# Patient Record
Sex: Male | Born: 1955
Health system: Southern US, Community
[De-identification: ages and names within clinical notes are randomized; demographics above are authoritative.]

## PROBLEM LIST (undated history)

## (undated) DIAGNOSIS — R42 Dizziness and giddiness: Secondary | ICD-10-CM

## (undated) DIAGNOSIS — F419 Anxiety disorder, unspecified: Secondary | ICD-10-CM

## (undated) DIAGNOSIS — R413 Other amnesia: Secondary | ICD-10-CM

## (undated) DIAGNOSIS — F418 Other specified anxiety disorders: Secondary | ICD-10-CM

## (undated) DIAGNOSIS — R55 Syncope and collapse: Principal | ICD-10-CM

## (undated) DIAGNOSIS — Z9889 Other specified postprocedural states: Secondary | ICD-10-CM

## (undated) DIAGNOSIS — F329 Major depressive disorder, single episode, unspecified: Secondary | ICD-10-CM

## (undated) DIAGNOSIS — F32A Depression, unspecified: Secondary | ICD-10-CM

## (undated) DIAGNOSIS — R112 Nausea with vomiting, unspecified: Secondary | ICD-10-CM

## (undated) HISTORY — PX: NECK SURGERY: SHX720

## (undated) HISTORY — DX: Other amnesia: R41.3

## (undated) HISTORY — DX: Syncope and collapse: R55

## (undated) HISTORY — PX: VASECTOMY: SHX75

## (undated) HISTORY — DX: Dizziness and giddiness: R42

## (undated) HISTORY — PX: OTHER SURGICAL HISTORY: SHX169

## (undated) HISTORY — DX: Other specified anxiety disorders: F41.8

## (undated) HISTORY — PX: TONSILLECTOMY: SUR1361

## (undated) NOTE — *Deleted (*Deleted)
Per [facility]:

---

## 2003-10-26 ENCOUNTER — Ambulatory Visit (HOSPITAL_COMMUNITY): Admission: RE | Admit: 2003-10-26 | Discharge: 2003-10-26 | Payer: Self-pay | Admitting: Orthopedic Surgery

## 2009-11-29 ENCOUNTER — Encounter: Admission: RE | Admit: 2009-11-29 | Discharge: 2009-11-29 | Payer: Self-pay | Admitting: Family Medicine

## 2010-02-19 ENCOUNTER — Ambulatory Visit (HOSPITAL_COMMUNITY): Admission: RE | Admit: 2010-02-19 | Discharge: 2010-02-20 | Payer: Self-pay | Admitting: Neurosurgery

## 2010-06-21 ENCOUNTER — Emergency Department (HOSPITAL_COMMUNITY): Admission: EM | Admit: 2010-06-21 | Discharge: 2010-06-22 | Payer: Self-pay | Admitting: Emergency Medicine

## 2011-01-06 LAB — CBC
HCT: 36.5 % — ABNORMAL LOW (ref 39.0–52.0)
Hemoglobin: 13 g/dL (ref 13.0–17.0)
MCHC: 35.8 g/dL (ref 30.0–36.0)
MCV: 94.6 fL (ref 78.0–100.0)
Platelets: 189 10*3/uL (ref 150–400)
RBC: 3.85 MIL/uL — ABNORMAL LOW (ref 4.22–5.81)
RDW: 12.7 % (ref 11.5–15.5)
WBC: 5.3 10*3/uL (ref 4.0–10.5)

## 2011-01-06 LAB — SURGICAL PCR SCREEN
MRSA, PCR: NEGATIVE
Staphylococcus aureus: POSITIVE — AB

## 2011-05-13 ENCOUNTER — Emergency Department (HOSPITAL_COMMUNITY)
Admission: EM | Admit: 2011-05-13 | Discharge: 2011-05-13 | Disposition: A | Discharge status: Home / Self Care | Payer: 59 | Attending: Emergency Medicine | Admitting: Emergency Medicine

## 2011-05-13 ENCOUNTER — Emergency Department (HOSPITAL_COMMUNITY): Payer: 59

## 2011-05-13 DIAGNOSIS — R0602 Shortness of breath: Secondary | ICD-10-CM | POA: Insufficient documentation

## 2011-05-13 DIAGNOSIS — R0989 Other specified symptoms and signs involving the circulatory and respiratory systems: Secondary | ICD-10-CM | POA: Insufficient documentation

## 2011-05-13 DIAGNOSIS — R0609 Other forms of dyspnea: Secondary | ICD-10-CM | POA: Insufficient documentation

## 2011-05-13 LAB — DIFFERENTIAL
Basophils Absolute: 0 10*3/uL (ref 0.0–0.1)
Eosinophils Relative: 3 % (ref 0–5)
Lymphocytes Relative: 38 % (ref 12–46)
Lymphs Abs: 1.7 10*3/uL (ref 0.7–4.0)
Monocytes Absolute: 0.4 10*3/uL (ref 0.1–1.0)
Neutro Abs: 2.3 10*3/uL (ref 1.7–7.7)

## 2011-05-13 LAB — CBC
HCT: 42.6 % (ref 39.0–52.0)
Hemoglobin: 14.6 g/dL (ref 13.0–17.0)
MCV: 91 fL (ref 78.0–100.0)
RDW: 12.2 % (ref 11.5–15.5)
WBC: 4.5 10*3/uL (ref 4.0–10.5)

## 2011-05-13 LAB — BASIC METABOLIC PANEL
CO2: 30 mEq/L (ref 19–32)
Chloride: 102 mEq/L (ref 96–112)
Glucose, Bld: 100 mg/dL — ABNORMAL HIGH (ref 70–99)
Sodium: 138 mEq/L (ref 135–145)

## 2011-05-13 LAB — TROPONIN I: Troponin I: <0.3 ng/mL (ref <0.30)

## 2011-05-13 LAB — PRO B NATRIURETIC PEPTIDE: Pro B Natriuretic peptide (BNP): 41.7 pg/mL (ref 0–125)

## 2013-03-21 ENCOUNTER — Encounter: Payer: Self-pay | Admitting: Neurology

## 2013-03-21 ENCOUNTER — Ambulatory Visit (INDEPENDENT_AMBULATORY_CARE_PROVIDER_SITE_OTHER): Payer: 59 | Admitting: Neurology

## 2013-03-21 VITALS — BP 113/69 | HR 49 | Ht 69.0 in | Wt 173.0 lb

## 2013-03-21 DIAGNOSIS — R413 Other amnesia: Secondary | ICD-10-CM

## 2013-03-21 DIAGNOSIS — R55 Syncope and collapse: Secondary | ICD-10-CM

## 2013-03-21 HISTORY — DX: Other amnesia: R41.3

## 2013-03-21 HISTORY — DX: Syncope and collapse: R55

## 2013-03-21 NOTE — Progress Notes (Signed)
Reason for visit: Dizziness  Shane Beard is a 57 y.o. male  History of present illness:  Shane Beard is a 57 year old right-handed white male with a history of episodes of near-syncope. The patient indicates that he has had some issues over the last 2 years. The patient indicates that he will have lightheaded sensations that occur when going from a stooping or bending position to full standing. The patient has had several episodes of near-syncope associated with visual loss, and occasionally with jerking of the extremities. The patient has a job requirement that includes frequent stooping and bending. The patient is a marathon runner, and he has had one episode of syncope after a marathon. The patient has undergone a cardiac evaluation with a stress test, and with cardiac monitoring for one month. The patient denies any focal numbness or weakness of the face, arms, or legs, with the exception that he does have some tingling in the hands that he relates to cervical spine disease. The patient however, goes on to indicate that he has developed a problem with memory over the last 2 years. This is progressively getting worse, associated with short-term memory problems, difficulty remembering names for people, and difficulty with directions while driving. The patient is having a harder time functioning at work. The patient denies any problems controlling the bowels or bladder, and he denies any significant problems with fatigue or any sleep issues. The patient is sent to this office for further evaluation.  Past Medical History  Diagnosis Date  . Dizziness   . Syncope and collapse 03/21/2013  . Memory difficulty 03/21/2013  . Depression with anxiety     Past Surgical History  Procedure Laterality Date  . Neck surgery    . Vasectomy    . Tonsillectomy      Family History  Problem Relation Age of Onset  . Thyroid disease Sister   . High blood pressure Brother   . Heart Problems Father      Social history:  reports that he has quit smoking. His smoking use included Cigarettes. He smoked 0.00 packs per day. He has never used smokeless tobacco. He reports that he drinks about 2.0 ounces of alcohol per week. He reports that he does not use illicit drugs.  Medications:  No current outpatient prescriptions on file prior to visit.   No current facility-administered medications on file prior to visit.    Allergies: No Known Allergies  ROS:  Out of a complete 14 system review of symptoms, the patient complains only of the following symptoms, and all other reviewed systems are negative.  Hearing loss, ringing in the ears Eye pain Memory loss, confusion, headache, slurred speech, dizziness  Blood pressure 113/69, pulse 49, height 5\' 9"  (1.753 m), weight 173 lb (78.472 kg).  Blood pressure standing, right arm, is 138/82. Blood pressure sitting, right arm, is 124/68.  Physical Exam  General: The patient is alert and cooperative at the time of the examination.  Head: Pupils are equal, round, and reactive to light. Discs are flat bilaterally.  Neck: The neck is supple, no carotid bruits are noted.  Respiratory: The respiratory examination is clear.  Cardiovascular: The cardiovascular examination reveals a regular rate and rhythm, no obvious murmurs or rubs are noted.  Skin: Extremities are without significant edema.  Neurologic Exam  Mental status: Mini-Mental status examination done today shows a total score of 30 out of 30. The patient is able to name 21 animals in 60 seconds.  Cranial nerves:  Facial symmetry is present. There is good sensation of the face to pinprick and soft touch bilaterally. The strength of the facial muscles and the muscles to head turning and shoulder shrug are normal bilaterally. Speech is well enunciated, no aphasia or dysarthria is noted. Extraocular movements are full. Visual fields are full.  Motor: The motor testing reveals 5 over 5  strength of all 4 extremities. Good symmetric motor tone is noted throughout.  Sensory: Sensory testing is intact to pinprick, soft touch, vibration sensation, and position sense on all 4 extremities. No evidence of extinction is noted.  Coordination: Cerebellar testing reveals good finger-nose-finger and heel-to-shin bilaterally.  Gait and station: Gait is normal. Tandem gait is normal. Romberg is negative. No drift is seen.  Reflexes: Deep tendon reflexes are symmetric and normal bilaterally, with the exception that the ankle jerk reflexes are depressed bilaterally. Toes are downgoing bilaterally.   Assessment/Plan:  1. History syncope, near-syncope  2. Memory disturbance  The patient appears to have syncope or near-syncope associated with rapid changes in position. The patient is a marathon runner, and he runs a relatively low heart rate. This may make him more susceptible to blackouts. The patient indicates that he maintains hydration throughout the day. The patient will need to be careful about standing up less rapidly, and he will need to be holding onto something up he gets dizzy, and bend over slightly to allow the episode past. The patient however, also has had some problems with memory. The patient will be set up for MRI evaluation of the brain, and carotid Doppler studies. The patient will have blood work done today. The patient will followup in about 6 months.  Marlan Palau MD 03/21/2013 8:37 PM  [facility] [address] [place], [place] [address]  Phone [phone] Fax [phone]

## 2013-03-22 LAB — RPR: RPR: NONREACTIVE

## 2013-03-22 LAB — TSH: TSH: 1.75 u[IU]/mL (ref 0.450–4.500)

## 2013-03-22 LAB — VITAMIN B12: Vitamin B-12: 627 pg/mL (ref 211–946)

## 2013-03-22 LAB — SEDIMENTATION RATE: Sed Rate: 2 mm/hr (ref 0–30)

## 2013-03-22 NOTE — Progress Notes (Signed)
Quick Note:  Left message that lab results were unremarkable, per Dr. Anne Hahn. ______

## 2013-03-23 ENCOUNTER — Ambulatory Visit (INDEPENDENT_AMBULATORY_CARE_PROVIDER_SITE_OTHER): Payer: 59

## 2013-03-23 DIAGNOSIS — R413 Other amnesia: Secondary | ICD-10-CM

## 2013-03-23 DIAGNOSIS — R55 Syncope and collapse: Secondary | ICD-10-CM

## 2013-03-24 ENCOUNTER — Telehealth: Payer: Self-pay | Admitting: Neurology

## 2013-03-24 NOTE — Telephone Encounter (Signed)
I called patient. The MRI study of the brain shows minimal small vessel changes, and some atrophy. The patient reports very mild memory disturbance, not clear if the atrophy is associated with this. The patient will need to be followed over time.

## 2013-04-04 ENCOUNTER — Ambulatory Visit (INDEPENDENT_AMBULATORY_CARE_PROVIDER_SITE_OTHER): Payer: 59

## 2013-04-04 DIAGNOSIS — R413 Other amnesia: Secondary | ICD-10-CM

## 2013-04-04 DIAGNOSIS — R55 Syncope and collapse: Secondary | ICD-10-CM

## 2013-04-18 ENCOUNTER — Telehealth: Payer: Self-pay | Admitting: Neurology

## 2013-04-18 NOTE — Telephone Encounter (Signed)
I called patient. The carotid Doppler study is unremarkable. 

## 2013-09-18 ENCOUNTER — Telehealth: Payer: Self-pay | Admitting: *Deleted

## 2013-09-19 NOTE — Telephone Encounter (Signed)
Please advise 

## 2013-09-19 NOTE — Telephone Encounter (Signed)
I called patient. The patient no longer having episodes of near-syncope. I will cancel his revisit appointment.

## 2013-09-28 ENCOUNTER — Ambulatory Visit: Payer: 59 | Admitting: Neurology

## 2014-01-10 ENCOUNTER — Other Ambulatory Visit: Payer: Self-pay | Admitting: Neurosurgery

## 2014-01-10 DIAGNOSIS — M5412 Radiculopathy, cervical region: Secondary | ICD-10-CM

## 2014-01-16 ENCOUNTER — Ambulatory Visit
Admission: RE | Admit: 2014-01-16 | Discharge: 2014-01-16 | Disposition: A | Discharge status: Home / Self Care | Payer: 59 | Source: Ambulatory Visit | Attending: Neurosurgery | Admitting: Neurosurgery

## 2014-01-16 DIAGNOSIS — M5412 Radiculopathy, cervical region: Secondary | ICD-10-CM

## 2014-02-09 ENCOUNTER — Other Ambulatory Visit: Payer: Self-pay | Admitting: Neurosurgery

## 2014-03-01 ENCOUNTER — Inpatient Hospital Stay (HOSPITAL_COMMUNITY): Admission: RE | Admit: 2014-03-01 | Payer: 59 | Source: Ambulatory Visit

## 2014-04-18 ENCOUNTER — Encounter (HOSPITAL_COMMUNITY): Payer: Self-pay | Admitting: Pharmacy Technician

## 2014-04-19 ENCOUNTER — Encounter (HOSPITAL_COMMUNITY)
Admission: RE | Admit: 2014-04-19 | Discharge: 2014-04-19 | Disposition: A | Discharge status: Home / Self Care | Payer: 59 | Source: Ambulatory Visit | Attending: Neurosurgery | Admitting: Neurosurgery

## 2014-04-19 ENCOUNTER — Encounter (HOSPITAL_COMMUNITY): Payer: Self-pay

## 2014-04-19 DIAGNOSIS — Z01812 Encounter for preprocedural laboratory examination: Secondary | ICD-10-CM | POA: Insufficient documentation

## 2014-04-19 HISTORY — DX: Major depressive disorder, single episode, unspecified: F32.9

## 2014-04-19 HISTORY — DX: Anxiety disorder, unspecified: F41.9

## 2014-04-19 HISTORY — DX: Nausea with vomiting, unspecified: R11.2

## 2014-04-19 HISTORY — DX: Depression, unspecified: F32.A

## 2014-04-19 HISTORY — DX: Other specified postprocedural states: Z98.890

## 2014-04-19 LAB — BASIC METABOLIC PANEL
Anion gap: 12 (ref 5–15)
BUN: 23 mg/dL (ref 6–23)
CO2: 28 mEq/L (ref 19–32)
Calcium: 9.1 mg/dL (ref 8.4–10.5)
Chloride: 99 mEq/L (ref 96–112)
Creatinine, Ser: 1.24 mg/dL (ref 0.50–1.35)
GFR calc Af Amer: 72 mL/min — ABNORMAL LOW (ref >90)
GFR calc non Af Amer: 62 mL/min — ABNORMAL LOW (ref >90)
Glucose, Bld: 92 mg/dL (ref 70–99)
Potassium: 4.2 mEq/L (ref 3.7–5.3)
Sodium: 139 mEq/L (ref 137–147)

## 2014-04-19 LAB — CBC
HCT: 39.1 % (ref 39.0–52.0)
Hemoglobin: 13.5 g/dL (ref 13.0–17.0)
MCH: 32.6 pg (ref 26.0–34.0)
MCHC: 34.5 g/dL (ref 30.0–36.0)
MCV: 94.4 fL (ref 78.0–100.0)
Platelets: 181 10*3/uL (ref 150–400)
RBC: 4.14 MIL/uL — ABNORMAL LOW (ref 4.22–5.81)
RDW: 12.3 % (ref 11.5–15.5)
WBC: 5.7 10*3/uL (ref 4.0–10.5)

## 2014-04-19 LAB — SURGICAL PCR SCREEN
MRSA, PCR: NEGATIVE
Staphylococcus aureus: POSITIVE — AB

## 2014-04-19 NOTE — Pre-Procedure Instructions (Signed)
ION GONNELLA  04/19/2014   Your procedure is scheduled on:  04/30/14  Report to [facility] Admitting at 530 AM.  Call this number if you have problems the morning of surgery: [phone]   Remember:   Do not eat food or drink liquids after midnight.   Take these medicines the morning of surgery with A SIP OF WATER: none   Do not wear jewelry, make-up or nail polish.  Do not wear lotions, powders, or perfumes. You may wear deodorant.  Do not shave 48 hours prior to surgery. Men may shave face and neck.  Do not bring valuables to the hospital.  [facility] is not responsible                  for any belongings or valuables.               Contacts, dentures or bridgework may not be worn into surgery.  Leave suitcase in the car. After surgery it may be brought to your room.  For patients admitted to the hospital, discharge time is determined by your                treatment team.               Patients discharged the day of surgery will not be allowed to drive  home.  Name and phone number of your driver: family  Special Instructions: Shower using CHG 2 nights before surgery and the night before surgery.  If you shower the day of surgery use CHG.  Use special wash - you have one bottle of CHG for all showers.  You should use approximately 1/3 of the bottle for each shower.   Please read over the following fact sheets that you were given: Pain Booklet, Coughing and Deep Breathing, MRSA Information and Surgical Site Infection Prevention

## 2014-04-29 MED ORDER — CEFAZOLIN SODIUM-DEXTROSE 2-3 GM-% IV SOLR
2.0000 g | INTRAVENOUS | Status: AC
  Administered 2014-04-30: 2 g via INTRAVENOUS
  Filled 2014-04-29: qty 50

## 2014-04-30 ENCOUNTER — Encounter (HOSPITAL_COMMUNITY)
Admission: RE | Disposition: A | Discharge status: Home / Self Care | Payer: Self-pay | Source: Ambulatory Visit | Attending: Neurosurgery

## 2014-04-30 ENCOUNTER — Ambulatory Visit (HOSPITAL_COMMUNITY): Payer: 59 | Admitting: Certified Registered"

## 2014-04-30 ENCOUNTER — Encounter (HOSPITAL_COMMUNITY): Payer: Self-pay | Admitting: Certified Registered"

## 2014-04-30 ENCOUNTER — Observation Stay (HOSPITAL_COMMUNITY)
Admission: RE | Admit: 2014-04-30 | Discharge: 2014-05-01 | Disposition: A | Discharge status: Home / Self Care | Payer: 59 | Source: Ambulatory Visit | Attending: Neurosurgery | Admitting: Neurosurgery

## 2014-04-30 ENCOUNTER — Encounter (HOSPITAL_COMMUNITY): Payer: 59 | Admitting: Certified Registered"

## 2014-04-30 ENCOUNTER — Ambulatory Visit (HOSPITAL_COMMUNITY): Payer: 59

## 2014-04-30 DIAGNOSIS — F411 Generalized anxiety disorder: Secondary | ICD-10-CM | POA: Insufficient documentation

## 2014-04-30 DIAGNOSIS — Y838 Other surgical procedures as the cause of abnormal reaction of the patient, or of later complication, without mention of misadventure at the time of the procedure: Secondary | ICD-10-CM | POA: Insufficient documentation

## 2014-04-30 DIAGNOSIS — Z87891 Personal history of nicotine dependence: Secondary | ICD-10-CM | POA: Insufficient documentation

## 2014-04-30 DIAGNOSIS — IMO0002 Reserved for concepts with insufficient information to code with codable children: Secondary | ICD-10-CM | POA: Insufficient documentation

## 2014-04-30 DIAGNOSIS — Y9210 Unspecified residential institution as the place of occurrence of the external cause: Secondary | ICD-10-CM | POA: Insufficient documentation

## 2014-04-30 DIAGNOSIS — S129XXA Fracture of neck, unspecified, initial encounter: Secondary | ICD-10-CM

## 2014-04-30 DIAGNOSIS — F329 Major depressive disorder, single episode, unspecified: Secondary | ICD-10-CM | POA: Insufficient documentation

## 2014-04-30 DIAGNOSIS — F3289 Other specified depressive episodes: Secondary | ICD-10-CM | POA: Insufficient documentation

## 2014-04-30 DIAGNOSIS — R338 Other retention of urine: Secondary | ICD-10-CM | POA: Insufficient documentation

## 2014-04-30 DIAGNOSIS — R413 Other amnesia: Secondary | ICD-10-CM | POA: Insufficient documentation

## 2014-04-30 DIAGNOSIS — T84498A Other mechanical complication of other internal orthopedic devices, implants and grafts, initial encounter: Principal | ICD-10-CM | POA: Insufficient documentation

## 2014-04-30 DIAGNOSIS — N9989 Other postprocedural complications and disorders of genitourinary system: Secondary | ICD-10-CM | POA: Insufficient documentation

## 2014-04-30 HISTORY — PX: POSTERIOR CERVICAL FUSION/FORAMINOTOMY: SHX5038

## 2014-04-30 SURGERY — POSTERIOR CERVICAL FUSION/FORAMINOTOMY LEVEL 1
Anesthesia: General | Laterality: Right

## 2014-04-30 MED ORDER — PHENOL 1.4 % MT LIQD: 1.0000 | OROMUCOSAL | Status: DC | PRN

## 2014-04-30 MED ORDER — KETOROLAC TROMETHAMINE 30 MG/ML IJ SOLN
30.0000 mg | Freq: Four times a day (QID) | INTRAMUSCULAR | Status: DC
  Administered 2014-04-30 – 2014-05-01 (×3): 30 mg via INTRAVENOUS
  Filled 2014-04-30 (×7): qty 1

## 2014-04-30 MED ORDER — ACETAMINOPHEN 10 MG/ML IV SOLN
INTRAVENOUS | Status: AC
  Administered 2014-04-30: 1000 mg via INTRAVENOUS
  Filled 2014-04-30: qty 100

## 2014-04-30 MED ORDER — CYCLOBENZAPRINE HCL 10 MG PO TABS
ORAL_TABLET | ORAL | Status: AC
Start: 2014-04-30 — End: 2014-05-01
  Filled 2014-04-30: qty 1

## 2014-04-30 MED ORDER — THROMBIN 20000 UNITS EX SOLR
CUTANEOUS | Status: DC | PRN
  Administered 2014-04-30: 11:00:00 via TOPICAL

## 2014-04-30 MED ORDER — PHENYLEPHRINE HCL 10 MG/ML IJ SOLN
INTRAMUSCULAR | Status: DC | PRN
  Administered 2014-04-30 (×3): 80 ug via INTRAVENOUS

## 2014-04-30 MED ORDER — EPHEDRINE SULFATE 50 MG/ML IJ SOLN
INTRAMUSCULAR | Status: DC | PRN
  Administered 2014-04-30: 20 mg via INTRAVENOUS

## 2014-04-30 MED ORDER — ACETAMINOPHEN 650 MG RE SUPP: 650.0000 mg | RECTAL | Status: DC | PRN

## 2014-04-30 MED ORDER — MIDAZOLAM HCL 2 MG/2ML IJ SOLN
INTRAMUSCULAR | Status: AC
  Filled 2014-04-30: qty 2

## 2014-04-30 MED ORDER — LIDOCAINE HCL (CARDIAC) 20 MG/ML IV SOLN
INTRAVENOUS | Status: AC
  Filled 2014-04-30: qty 5

## 2014-04-30 MED ORDER — SODIUM CHLORIDE 0.9 % IJ SOLN: 3.0000 mL | Freq: Two times a day (BID) | INTRAMUSCULAR | Status: DC

## 2014-04-30 MED ORDER — HYDROMORPHONE HCL PF 1 MG/ML IJ SOLN
INTRAMUSCULAR | Status: AC
  Filled 2014-04-30: qty 1

## 2014-04-30 MED ORDER — NEOSTIGMINE METHYLSULFATE 10 MG/10ML IV SOLN
INTRAVENOUS | Status: DC | PRN
  Administered 2014-04-30: 3 mg via INTRAVENOUS

## 2014-04-30 MED ORDER — SODIUM CHLORIDE 0.9 % IV SOLN: 250.0000 mL | INTRAVENOUS | Status: DC

## 2014-04-30 MED ORDER — MIDAZOLAM HCL 5 MG/5ML IJ SOLN
INTRAMUSCULAR | Status: DC | PRN
  Administered 2014-04-30: 2 mg via INTRAVENOUS

## 2014-04-30 MED ORDER — ARTIFICIAL TEARS OP OINT
TOPICAL_OINTMENT | OPHTHALMIC | Status: DC | PRN
  Administered 2014-04-30: 1 via OPHTHALMIC

## 2014-04-30 MED ORDER — CYCLOBENZAPRINE HCL 10 MG PO TABS
10.0000 mg | ORAL_TABLET | Freq: Three times a day (TID) | ORAL | Status: DC | PRN
  Administered 2014-04-30: 10 mg via ORAL

## 2014-04-30 MED ORDER — HYDROMORPHONE HCL PF 1 MG/ML IJ SOLN
0.2500 mg | INTRAMUSCULAR | Status: DC | PRN
  Administered 2014-04-30 (×3): 0.5 mg via INTRAVENOUS

## 2014-04-30 MED ORDER — OXYCODONE-ACETAMINOPHEN 5-325 MG PO TABS
1.0000 | ORAL_TABLET | ORAL | Status: DC | PRN
  Administered 2014-04-30: 2 via ORAL

## 2014-04-30 MED ORDER — ROCURONIUM BROMIDE 100 MG/10ML IV SOLN
INTRAVENOUS | Status: DC | PRN
  Administered 2014-04-30: 50 mg via INTRAVENOUS

## 2014-04-30 MED ORDER — 0.9 % SODIUM CHLORIDE (POUR BTL) OPTIME
TOPICAL | Status: DC | PRN
  Administered 2014-04-30: 1000 mL

## 2014-04-30 MED ORDER — SCOPOLAMINE 1 MG/3DAYS TD PT72
MEDICATED_PATCH | TRANSDERMAL | Status: DC | PRN
  Administered 2014-04-30: 1 via TRANSDERMAL

## 2014-04-30 MED ORDER — PROPOFOL 10 MG/ML IV BOLUS
INTRAVENOUS | Status: AC
  Filled 2014-04-30: qty 20

## 2014-04-30 MED ORDER — ACETAMINOPHEN 325 MG PO TABS
650.0000 mg | ORAL_TABLET | ORAL | Status: DC | PRN
  Administered 2014-05-01: 650 mg via ORAL
  Filled 2014-04-30: qty 2

## 2014-04-30 MED ORDER — LIDOCAINE HCL (CARDIAC) 20 MG/ML IV SOLN
INTRAVENOUS | Status: DC | PRN
  Administered 2014-04-30: 100 mg via INTRATRACHEAL
  Administered 2014-04-30: 60 mg via INTRAVENOUS

## 2014-04-30 MED ORDER — KETOROLAC TROMETHAMINE 30 MG/ML IJ SOLN
30.0000 mg | Freq: Once | INTRAMUSCULAR | Status: AC
  Administered 2014-04-30: 30 mg via INTRAVENOUS

## 2014-04-30 MED ORDER — FENTANYL CITRATE 0.05 MG/ML IJ SOLN
INTRAMUSCULAR | Status: DC | PRN
  Administered 2014-04-30: 150 ug via INTRAVENOUS

## 2014-04-30 MED ORDER — BISACODYL 10 MG RE SUPP: 10.0000 mg | Freq: Every day | RECTAL | Status: DC | PRN

## 2014-04-30 MED ORDER — ONDANSETRON HCL 4 MG/2ML IJ SOLN
INTRAMUSCULAR | Status: DC | PRN
  Administered 2014-04-30: 4 mg via INTRAVENOUS

## 2014-04-30 MED ORDER — FENTANYL CITRATE 0.05 MG/ML IJ SOLN
INTRAMUSCULAR | Status: AC
  Filled 2014-04-30: qty 5

## 2014-04-30 MED ORDER — GLYCOPYRROLATE 0.2 MG/ML IJ SOLN
INTRAMUSCULAR | Status: DC | PRN
  Administered 2014-04-30 (×2): 0.4 mg via INTRAVENOUS

## 2014-04-30 MED ORDER — HYDROCODONE-ACETAMINOPHEN 5-325 MG PO TABS: 1.0000 | ORAL_TABLET | ORAL | Status: DC | PRN

## 2014-04-30 MED ORDER — BUPIVACAINE HCL (PF) 0.5 % IJ SOLN
INTRAMUSCULAR | Status: DC | PRN
  Administered 2014-04-30: 4.25 mL

## 2014-04-30 MED ORDER — MORPHINE SULFATE 4 MG/ML IJ SOLN
4.0000 mg | INTRAMUSCULAR | Status: DC | PRN
Start: 2014-04-30 — End: 2014-05-01

## 2014-04-30 MED ORDER — SCOPOLAMINE 1 MG/3DAYS TD PT72
MEDICATED_PATCH | TRANSDERMAL | Status: AC
  Filled 2014-04-30: qty 1

## 2014-04-30 MED ORDER — PROPOFOL 10 MG/ML IV BOLUS
INTRAVENOUS | Status: DC | PRN
  Administered 2014-04-30: 200 mg via INTRAVENOUS

## 2014-04-30 MED ORDER — ALUM & MAG HYDROXIDE-SIMETH 200-200-20 MG/5ML PO SUSP: 30.0000 mL | Freq: Four times a day (QID) | ORAL | Status: DC | PRN

## 2014-04-30 MED ORDER — HYDROXYZINE HCL 25 MG PO TABS: 50.0000 mg | ORAL_TABLET | ORAL | Status: DC | PRN

## 2014-04-30 MED ORDER — ONDANSETRON HCL 4 MG/2ML IJ SOLN: 4.0000 mg | Freq: Four times a day (QID) | INTRAMUSCULAR | Status: DC | PRN

## 2014-04-30 MED ORDER — HYDROMORPHONE HCL PF 1 MG/ML IJ SOLN
INTRAMUSCULAR | Status: AC
  Administered 2014-04-30: 0.5 mg via INTRAVENOUS
  Filled 2014-04-30: qty 1

## 2014-04-30 MED ORDER — BACITRACIN ZINC 500 UNIT/GM EX OINT
TOPICAL_OINTMENT | CUTANEOUS | Status: DC | PRN
  Administered 2014-04-30: 1 via TOPICAL

## 2014-04-30 MED ORDER — KCL IN DEXTROSE-NACL 20-5-0.45 MEQ/L-%-% IV SOLN
INTRAVENOUS | Status: DC
Start: 2014-04-30 — End: 2014-05-01
  Filled 2014-04-30 (×5): qty 1000

## 2014-04-30 MED ORDER — NEOSTIGMINE METHYLSULFATE 10 MG/10ML IV SOLN
INTRAVENOUS | Status: AC
  Filled 2014-04-30: qty 1

## 2014-04-30 MED ORDER — DEXAMETHASONE SODIUM PHOSPHATE 4 MG/ML IJ SOLN
INTRAMUSCULAR | Status: DC | PRN
  Administered 2014-04-30: 8 mg via INTRAVENOUS

## 2014-04-30 MED ORDER — BACITRACIN 50000 UNITS IM SOLR
INTRAMUSCULAR | Status: DC | PRN
  Administered 2014-04-30: 10:00:00

## 2014-04-30 MED ORDER — ROCURONIUM BROMIDE 50 MG/5ML IV SOLN
INTRAVENOUS | Status: AC
  Filled 2014-04-30: qty 1

## 2014-04-30 MED ORDER — LACTATED RINGERS IV SOLN
INTRAVENOUS | Status: DC
  Administered 2014-04-30 (×3): via INTRAVENOUS

## 2014-04-30 MED ORDER — MAGNESIUM HYDROXIDE 400 MG/5ML PO SUSP: 30.0000 mL | Freq: Every day | ORAL | Status: DC | PRN

## 2014-04-30 MED ORDER — GLYCOPYRROLATE 0.2 MG/ML IJ SOLN
INTRAMUSCULAR | Status: AC
  Filled 2014-04-30: qty 1

## 2014-04-30 MED ORDER — MENTHOL 3 MG MT LOZG: 1.0000 | LOZENGE | OROMUCOSAL | Status: DC | PRN

## 2014-04-30 MED ORDER — KETOROLAC TROMETHAMINE 30 MG/ML IJ SOLN
INTRAMUSCULAR | Status: AC
  Filled 2014-04-30: qty 1

## 2014-04-30 MED ORDER — LIDOCAINE-EPINEPHRINE 1 %-1:100000 IJ SOLN
INTRAMUSCULAR | Status: DC | PRN
  Administered 2014-04-30: 4.25 mL

## 2014-04-30 MED ORDER — ONDANSETRON HCL 4 MG/2ML IJ SOLN: 4.0000 mg | Freq: Once | INTRAMUSCULAR | Status: DC | PRN

## 2014-04-30 MED ORDER — SODIUM CHLORIDE 0.9 % IJ SOLN: 3.0000 mL | INTRAMUSCULAR | Status: DC | PRN

## 2014-04-30 SURGICAL SUPPLY — 78 items
ADH SKN CLS APL DERMABOND .7 (GAUZE/BANDAGES/DRESSINGS) ×1
ADH SKN CLS LQ APL DERMABOND (GAUZE/BANDAGES/DRESSINGS) ×1
BAG DECANTER FOR FLEXI CONT (MISCELLANEOUS) ×2 IMPLANT
BIT DRILL NEURO 2X3.1 SFT TUCH (MISCELLANEOUS) ×1 IMPLANT
BIT DRILL WIRE PASS 1.3MM (BIT) IMPLANT
BLADE 10 SAFETY STRL DISP (BLADE) ×2 IMPLANT
BLADE SURG 11 STRL SS (BLADE) ×2 IMPLANT
BLADE SURG ROTATE 9660 (MISCELLANEOUS) ×2 IMPLANT
BLOCKER OASYS (Neuro Prosthesis/Implant) ×4 IMPLANT
BRUSH SCRUB EZ PLAIN DRY (MISCELLANEOUS) ×2 IMPLANT
CANISTER SUCT 3000ML (MISCELLANEOUS) ×2 IMPLANT
CONT SPEC 4OZ CLIKSEAL STRL BL (MISCELLANEOUS) ×2 IMPLANT
COVER TABLE BACK 60X90 (DRAPES) ×2 IMPLANT
DECANTER SPIKE VIAL GLASS SM (MISCELLANEOUS) ×2 IMPLANT
DERMABOND ADHESIVE PROPEN (GAUZE/BANDAGES/DRESSINGS) ×1
DERMABOND ADVANCED (GAUZE/BANDAGES/DRESSINGS) ×1
DERMABOND ADVANCED .7 DNX12 (GAUZE/BANDAGES/DRESSINGS) ×1 IMPLANT
DERMABOND ADVANCED .7 DNX6 (GAUZE/BANDAGES/DRESSINGS) IMPLANT
DRAPE C-ARM 42X72 X-RAY (DRAPES) ×4 IMPLANT
DRAPE LAPAROTOMY 100X72 PEDS (DRAPES) ×2 IMPLANT
DRAPE MICROSCOPE LEICA (MISCELLANEOUS) ×1 IMPLANT
DRAPE POUCH INSTRU U-SHP 10X18 (DRAPES) ×2 IMPLANT
DRAPE PROXIMA HALF (DRAPES) IMPLANT
DRILL NEURO 2X3.1 SOFT TOUCH (MISCELLANEOUS) ×2
DRILL OASYS 2.5MM (BIT) IMPLANT
DRILL WIRE PASS 1.3MM (BIT)
DRIUS OASYS 2.5MM (BIT) ×2
DRSG EMULSION OIL 3X3 NADH (GAUZE/BANDAGES/DRESSINGS) IMPLANT
ELECT REM PT RETURN 9FT ADLT (ELECTROSURGICAL) ×2
ELECTRODE REM PT RTRN 9FT ADLT (ELECTROSURGICAL) ×1 IMPLANT
EVACUATOR 1/8 PVC DRAIN (DRAIN) IMPLANT
GAUZE SPONGE 4X4 16PLY XRAY LF (GAUZE/BANDAGES/DRESSINGS) IMPLANT
GLOVE BIOGEL PI IND STRL 7.5 (GLOVE) IMPLANT
GLOVE BIOGEL PI IND STRL 8 (GLOVE) ×1 IMPLANT
GLOVE BIOGEL PI INDICATOR 7.5 (GLOVE) ×1
GLOVE BIOGEL PI INDICATOR 8 (GLOVE) ×2
GLOVE ECLIPSE 7.5 STRL STRAW (GLOVE) ×6 IMPLANT
GLOVE EXAM NITRILE LRG STRL (GLOVE) IMPLANT
GLOVE EXAM NITRILE MD LF STRL (GLOVE) IMPLANT
GLOVE EXAM NITRILE XL STR (GLOVE) IMPLANT
GLOVE EXAM NITRILE XS STR PU (GLOVE) IMPLANT
GOWN STRL REUS W/ TWL LRG LVL3 (GOWN DISPOSABLE) IMPLANT
GOWN STRL REUS W/ TWL XL LVL3 (GOWN DISPOSABLE) ×1 IMPLANT
GOWN STRL REUS W/TWL 2XL LVL3 (GOWN DISPOSABLE) ×1 IMPLANT
GOWN STRL REUS W/TWL LRG LVL3 (GOWN DISPOSABLE) ×2
GOWN STRL REUS W/TWL XL LVL3 (GOWN DISPOSABLE) ×4
HEMOSTAT SURGICEL 2X14 (HEMOSTASIS) IMPLANT
KIT BASIN OR (CUSTOM PROCEDURE TRAY) ×2 IMPLANT
KIT INFUSE XX SMALL 0.7CC (Orthopedic Implant) ×1 IMPLANT
KIT ROOM TURNOVER OR (KITS) ×2 IMPLANT
NDL SPNL 18GX3.5 QUINCKE PK (NEEDLE) ×1 IMPLANT
NDL SPNL 22GX3.5 QUINCKE BK (NEEDLE) ×2 IMPLANT
NEEDLE SPNL 18GX3.5 QUINCKE PK (NEEDLE) ×2 IMPLANT
NEEDLE SPNL 22GX3.5 QUINCKE BK (NEEDLE) ×4 IMPLANT
NS IRRIG 1000ML POUR BTL (IV SOLUTION) ×2 IMPLANT
PACK LAMINECTOMY NEURO (CUSTOM PROCEDURE TRAY) ×2 IMPLANT
PAD ARMBOARD 7.5X6 YLW CONV (MISCELLANEOUS) ×6 IMPLANT
PIN MAYFIELD SKULL DISP (PIN) ×2 IMPLANT
RUBBERBAND STERILE (MISCELLANEOUS) ×2 IMPLANT
SCREW BIASED ANGLE 3.5X12 (Screw) ×1 IMPLANT
SCREW BIASED ANGLE 3.5X14 (Screw) ×3 IMPLANT
SPONGE GAUZE 4X4 12PLY (GAUZE/BANDAGES/DRESSINGS) ×1 IMPLANT
SPONGE LAP 4X18 X RAY DECT (DISPOSABLE) IMPLANT
SPONGE SURGIFOAM ABS GEL 100 (HEMOSTASIS) ×2 IMPLANT
STAPLER SKIN PROX WIDE 3.9 (STAPLE) ×2 IMPLANT
STRIP BIOACTIVE VITOSS 25X52X4 (Orthopedic Implant) ×1 IMPLANT
SUT ETHILON 3 0 FSL (SUTURE) IMPLANT
SUT VIC AB 0 CT1 18XCR BRD8 (SUTURE) ×1 IMPLANT
SUT VIC AB 0 CT1 8-18 (SUTURE) ×4
SUT VIC AB 2-0 CP2 18 (SUTURE) ×3 IMPLANT
SYR 20ML ECCENTRIC (SYRINGE) ×2 IMPLANT
TAP 3.5MM (TAP) ×1 IMPLANT
TAPE CLOTH SURG 4X10 WHT LF (GAUZE/BANDAGES/DRESSINGS) ×1 IMPLANT
TOWEL OR 17X24 6PK STRL BLUE (TOWEL DISPOSABLE) ×2 IMPLANT
TOWEL OR 17X26 10 PK STRL BLUE (TOWEL DISPOSABLE) ×2 IMPLANT
TRAY FOLEY CATH 14FRSI W/METER (CATHETERS) IMPLANT
UNDERPAD 30X30 INCONTINENT (UNDERPADS AND DIAPERS) ×2 IMPLANT
WATER STERILE IRR 1000ML POUR (IV SOLUTION) ×2 IMPLANT

## 2014-04-30 NOTE — Op Note (Signed)
04/30/2014  11:40 AM  PATIENT:  Shane Beard  58 y.o. 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 The radiolucent 3 pin Mayfield holder was applied, and the patient was turned to a prone position. The posterior neck and upper back were prepped with Betadine soap and solution and draped in a sterile fashion. C-arm fluoroscopy was used throughout the case, and we localized the C4-5 level, and infiltrated the overlying skin with local anesthetic. Midline incision was made over the C4-5 level and carried down to the subcutaneous tissue. Bipolar cautery which were used to maintain hemostasis. Dissection was carried down to the midline to the cervical fascia was incised bilaterally, and the paracervical musculature was dissected from the spinous process and lamina in a subperiosteal fashion. Using C-arm fluoroscopy we identified the C4-5 intralaminar space, and identified the lateral masses on either side. Screw holes were made bilaterally at the C4 and C5 levels. Each was started with the pilot hole, and continued by a hand drill and a inferior posterior medial to superior anterolateral trajectory. Each was examined with the ball probe, and good bony surfaces were found. The posterior cortex was  tapped, and we placed 3.5 x 14 mm screws bilaterally at C4 as well as in the right side at C5 and we placed a 3.5 x 12 mm screw on the left side at C5. The operating microscope was then draped, and torn the field to provide additional medication, illumination, and visualization, and we proceeded with the right C4-5 cervical laminotomy and foraminotomy. Laminotomy and foraminotomy were begun with the high-speed drill. We continued the decompression with that 2 mm Kerrison punch with a thin footplate. The hypertrophic facet arthropathy was carefully removed, decompressing the exiting nerve root and neural foramen. We encountered bleeding from epidural veins that was controlled with Gelfoam with thrombin. Once the decompression was completed, we continued the arthrodesis. A 60 mm rod was cut to 25 mm and 35 mm segments. Each was gently contoured with the rod bender. We placed a 25 mm rod on the left, and 35 mm on the right. They were secured with locking caps, and once all 4 locking caps were placed, final tightening was performed against a counter torque. We then decorticated the laminar surfaces, and packed a pledget of infuse over the left laminar surfaces, and packed Vitoss BA over the laminar surfaces bilaterally. We then proceeded with closure. The paraspinal muscles were approximate interrupted undyed 0 Vicryl suture, the deep fascia was closed with interrupted undyed 0 Vicryl suture, the subcutaneous and subcuticular closed with interrupted inverted 2-0 undyed suture, and the skin edges were Dermabond. There was dressed with sterile gauze and Hypafix.  PLAN OF CARE: Admit for overnight observation  PATIENT DISPOSITION:  PACU - hemodynamically stable.   Delay start of Pharmacological VTE agent (>24hrs) due to surgical blood loss or risk of bleeding:  yes

## 2014-04-30 NOTE — H&P (Signed)
Subjective:  Patient is a 58 y.o. 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 Patient is status post a C4-5 ACDF in May of 2011. He is status post a previous C5-6 ACDF in 1989 elsewhere. Symptomatically his been having pain for the rest of his neck, extending into the right shoulder and arm. He has not responded to NSAIDS. He is admitted for a C4-5 posterior cervical arthrodesis as well as a right C4-5 cervical laminotomy and foraminotomy.   Patient Active Problem List    Diagnosis  Date Noted   .  Syncope and collapse  03/21/2013   .  Memory difficulty  03/21/2013    Past Medical History   Diagnosis  Date   .  Dizziness    .  Syncope and collapse  03/21/2013   .  Memory difficulty  03/21/2013   .  Depression with anxiety    .  Anxiety    .  Depression    .  PONV (postoperative nausea and vomiting)     Past Surgical History   Procedure  Laterality  Date   .  Neck surgery     .  Vasectomy     .  Tonsillectomy      Prescriptions prior to admission   Medication  Sig  Dispense  Refill   .  Cholecalciferol (VITAMIN D-3) 5000 UNITS TABS  Take 5,000 Units by mouth daily.     Marland Kitchen  MAGNESIUM-POTASSIUM PO  Take 1 tablet by mouth 2 (two) times daily.     .  nabumetone (RELAFEN) 500 MG tablet  Take 500 mg by mouth 2 (two) times daily.     .  Omega-3 Fatty Acids (FISH OIL) 1000 MG CAPS  Take by mouth 2 (two) times daily.     .  Probiotic Product (PROBIOTIC PO)  Take 2 capsules by mouth daily.     Marland Kitchen  SPIRULINA PO  Take by mouth 3 (three) times daily.      No Known Allergies  History   Substance Use Topics   .  Smoking status:  Former Smoker     Types:  Cigarettes   .  Smokeless tobacco:  Never Used      Comment: quit 26 years ago   .  Alcohol Use:  2.0 oz/week     4 drink(s) per week      Comment: 3 or 4 beers on weekends     Family History   Problem  Relation  Age of Onset   .  Thyroid disease  Sister    .  High blood pressure  Brother    .  Heart Problems  Father     Review of Systems  A comprehensive review of systems was negative.   Objective:  Vital signs in last 24 hours:  Temp: [97.4 F (36.3 C)] 97.4 F (36.3 C) (07/13 0626)  Pulse Rate: [44] 44 (07/13 0626)  BP: (114)/(66) 114/66 mmHg (07/13 0626)  SpO2: [100 %] 100 % (07/13 0626)  EXAM: Patient well-developed, well-nourished white male, in no acute distress. Lungs are clear to auscultation , the patient has symmetrical respiratory excursion. Heart has a regular rate and rhythm normal S1 and S2 no murmur. Abdomen is soft nontender nondistended bowel sounds are present. Extremity examination shows no clubbing cyanosis or edema.  Motor examination shows 5 over 5 strength  in the upper extremities including the deltoid biceps triceps and intrinsics and grip. Sensation is intact to pinprick throughout the digits of the upper extremities. Reflexes are symmetrical and without evidence of pathologic reflexes. Patient has a normal gait and stance.   Data Review:CBC    Component  Value  Date/Time    WBC  5.7  04/19/2014 1522    RBC  4.14*  04/19/2014 1522    HGB  13.5  04/19/2014 1522    HCT  39.1  04/19/2014 1522    PLT  181  04/19/2014 1522    MCV  94.4  04/19/2014 1522    MCH  32.6  04/19/2014 1522    MCHC  34.5  04/19/2014 1522    RDW  12.3  04/19/2014 1522    LYMPHSABS  1.7  05/13/2011 1010    MONOABS  0.4  05/13/2011 1010    EOSABS  0.1  05/13/2011 1010    BASOSABS  0.0  05/13/2011 1010    BMET    Component  Value  Date/Time    NA  139  04/19/2014 1522    K  4.2  04/19/2014 1522    CL  99  04/19/2014 1522    CO2  28  04/19/2014 1522    GLUCOSE  92  04/19/2014 1522    BUN  23  04/19/2014 1522    CREATININE  1.24  04/19/2014 1522    CALCIUM  9.1  04/19/2014 1522    GFRNONAA  62*  04/19/2014 1522    GFRAA  72*  04/19/2014 1522    Assessment/Plan:  Patient presented with  right cervical radicular pain, and who has been found to have a nonunion and pseudoarthrosis at the C4-5 level following an ACDF in May 2011. Workup also revealed osteophytic right C4-5 neural foraminal stenosis. He is admitted now for a right C4-5 posterior cervical laminotomy foraminotomy, and a bilateral C4-5 posterior cervical arthrodesis with lateral mass screws and rods, and bone graft. I've discussed with the patient the nature of his condition, the nature the surgical procedure, the typical length of surgery, hospital stay, and overall recuperation. We discussed limitations postoperatively. I discussed risks of surgery including risks of infection, bleeding, possibly need for transfusion, the risk of nerve root dysfunction with pain, weakness, numbness, or paresthesias, the risk of spinal cord dysfunction with paralysis of all 4 limbs and quadriplegia, and the risk of dural tear and CSF leakage and possible need for further surgery, the risk of failure of the arthrodesis and the possible need for further surgery, and the risk of anesthetic complications including myocardial infarction, stroke, pneumonia, and death. We also discussed the need for postoperative immobilization in a cervical collar. Understanding all this the patient does wish to proceed with surgery and is admitted for such.    Hosie Spangle, MD  04/30/2014 7:25 AM

## 2014-04-30 NOTE — Progress Notes (Signed)
Filed Vitals:   04/30/14 1300 04/30/14 1315 04/30/14 1419 04/30/14 1553  BP: 134/81 135/75 148/75 117/68  Pulse: 40 42 55 52  Temp:    98.4 F (36.9 C)  Resp: 10 11 16 16   SpO2: 99% 97% 98% 93%    Patient has been up and ambulating the halls. His dressing is clean and dry. He has had excellent relief of his radicular pain. He's had small voids, and I have asked the nursing staff to check a post void residual with a bladder scan.  Plan: Continued to progress to postoperative recovery.  Hosie Spangle, MD 04/30/2014, 7:15 PM

## 2014-04-30 NOTE — Anesthesia Postprocedure Evaluation (Signed)
  Anesthesia Post-op Note  Patient: Shane Beard  Procedure(s) Performed: Procedure(s) with comments: CERVICAL FOUR FIVE POSTERIOR CERVICAL FUSION/FORAMINOTOMY LEVEL 1 (Right) - Right C45 laminotomy and foraminotomy with posterior arthrodesis with instrumentation and bonegraft  Patient Location: PACU  Anesthesia Type:General  Level of Consciousness: awake, alert , oriented and patient cooperative  Airway and Oxygen Therapy: Patient Spontanous Breathing  Post-op Pain: mild  Post-op Assessment: Post-op Vital signs reviewed, Patient's Cardiovascular Status Stable, Respiratory Function Stable, Patent Airway, No signs of Nausea or vomiting and Pain level controlled  Post-op Vital Signs: stable  Last Vitals:  Filed Vitals:   04/30/14 1205  BP: 154/78  Pulse: 48  Temp:   Resp: 17    Complications: No apparent anesthesia complications

## 2014-04-30 NOTE — Transfer of Care (Signed)
Immediate Anesthesia Transfer of Care Note  Patient: Shane Beard  Procedure(s) Performed: Procedure(s) with comments: CERVICAL FOUR FIVE POSTERIOR CERVICAL FUSION/FORAMINOTOMY LEVEL 1 (Right) - Right C45 laminotomy and foraminotomy with posterior arthrodesis with instrumentation and bonegraft  Patient Location: PACU  Anesthesia Type:General  Level of Consciousness: awake, alert , oriented and patient cooperative  Airway & Oxygen Therapy: Patient Spontanous Breathing and Patient connected to nasal cannula oxygen  Post-op Assessment: Report given to PACU RN, Post -op Vital signs reviewed and stable and Patient moving all extremities  Post vital signs: Reviewed and stable  Complications: No apparent anesthesia complications

## 2014-04-30 NOTE — Anesthesia Preprocedure Evaluation (Signed)
Anesthesia Evaluation  Patient identified by MRN, date of birth, ID band Patient awake    Reviewed: Allergy & Precautions, H&P , NPO status , Patient's Chart, lab work & pertinent test results  History of Anesthesia Complications (+) PONV  Airway       Dental   Pulmonary former smoker,          Cardiovascular     Neuro/Psych    GI/Hepatic   Endo/Other    Renal/GU      Musculoskeletal   Abdominal   Peds  Hematology   Anesthesia Other Findings Memory disorder  Reproductive/Obstetrics                           Anesthesia Physical Anesthesia Plan  ASA: II  Anesthesia Plan: General   Post-op Pain Management:    Induction: Intravenous  Airway Management Planned: Oral ETT  Additional Equipment:   Intra-op Plan:   Post-operative Plan: Extubation in OR  Informed Consent: I have reviewed the patients History and Physical, chart, labs and discussed the procedure including the risks, benefits and alternatives for the proposed anesthesia with the patient or authorized representative who has indicated his/her understanding and acceptance.     Plan Discussed with:   Anesthesia Plan Comments:         Anesthesia Quick Evaluation

## 2014-04-30 NOTE — Discharge Summary (Deleted)
Subjective: Patient is a 58 y.o. 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 Patient is status post a C4-5 ACDF in May of 2011. He is status post a previous C5-6 ACDF in 1989 elsewhere. Symptomatically his been having pain for the rest of his neck, extending into the right shoulder and arm. He has not responded to NSAIDS. He is admitted for a C4-5 posterior cervical arthrodesis as well as a right C4-5 cervical laminotomy and foraminotomy.   Patient Active Problem List   Diagnosis Date Noted  . Syncope and collapse 03/21/2013  . Memory difficulty 03/21/2013   Past Medical History  Diagnosis Date  . Dizziness   . Syncope and collapse 03/21/2013  . Memory difficulty 03/21/2013  . Depression with anxiety   . Anxiety   . Depression   . PONV (postoperative nausea and vomiting)     Past Surgical History  Procedure Laterality Date  . Neck surgery    . Vasectomy    . Tonsillectomy      Prescriptions prior to admission  Medication Sig Dispense Refill  . Cholecalciferol (VITAMIN D-3) 5000 UNITS TABS Take 5,000 Units by mouth daily.      Marland Kitchen MAGNESIUM-POTASSIUM PO Take 1 tablet by mouth 2 (two) times daily.      . nabumetone (RELAFEN) 500 MG tablet Take 500 mg by mouth 2 (two) times daily.      . Omega-3 Fatty Acids (FISH OIL) 1000 MG CAPS Take by mouth 2 (two) times daily.      . Probiotic Product (PROBIOTIC PO) Take 2 capsules by mouth daily.      Marland Kitchen SPIRULINA PO Take by mouth 3 (three) times daily.       No Known Allergies  History  Substance Use Topics  . Smoking status: Former Smoker    Types: Cigarettes  . Smokeless tobacco: Never Used     Comment: quit 26 years ago  . Alcohol Use: 2.0 oz/week    4 drink(s) per week     Comment: 3 or 4 beers on weekends    Family History  Problem Relation Age of Onset  . Thyroid  disease Sister   . High blood pressure Brother   . Heart Problems Father      Review of Systems A comprehensive review of systems was negative.  Objective: Vital signs in last 24 hours: Temp:  [97.4 F (36.3 C)] 97.4 F (36.3 C) (07/13 0626) Pulse Rate:  [44] 44 (07/13 0626) BP: (114)/(66) 114/66 mmHg (07/13 0626) SpO2:  [100 %] 100 % (07/13 0626)  EXAM: Patient well-developed, well-nourished white male, in no acute distress. Lungs are clear to auscultation , the patient has symmetrical respiratory excursion. Heart has a regular rate and rhythm normal S1 and S2 no murmur.   Abdomen is soft nontender nondistended bowel sounds are present. Extremity examination shows no clubbing cyanosis or edema. Motor examination shows 5 over 5 strength in the upper extremities including the deltoid biceps triceps and intrinsics and grip. Sensation is intact to pinprick throughout the digits of the upper extremities. Reflexes are symmetrical and without evidence of pathologic reflexes. Patient has a normal gait and stance.   Data Review:CBC    Component Value Date/Time   WBC 5.7 04/19/2014 1522   RBC 4.14* 04/19/2014 1522   HGB 13.5 04/19/2014 1522   HCT 39.1 04/19/2014 1522  PLT 181 04/19/2014 1522   MCV 94.4 04/19/2014 1522   MCH 32.6 04/19/2014 1522   MCHC 34.5 04/19/2014 1522   RDW 12.3 04/19/2014 1522   LYMPHSABS 1.7 05/13/2011 1010   MONOABS 0.4 05/13/2011 1010   EOSABS 0.1 05/13/2011 1010   BASOSABS 0.0 05/13/2011 1010                          BMET    Component Value Date/Time   NA 139 04/19/2014 1522   K 4.2 04/19/2014 1522   CL 99 04/19/2014 1522   CO2 28 04/19/2014 1522   GLUCOSE 92 04/19/2014 1522   BUN 23 04/19/2014 1522   CREATININE 1.24 04/19/2014 1522   CALCIUM 9.1 04/19/2014 1522   GFRNONAA 62* 04/19/2014 1522   GFRAA 72* 04/19/2014 1522     Assessment/Plan: Patient presented with right cervical radicular pain, and who has been found to have a nonunion and pseudoarthrosis at the C4-5 level following an  ACDF in May 2011. Workup also revealed osteophytic right C4-5 neural foraminal stenosis. He is admitted now for a right C4-5 posterior cervical laminotomy foraminotomy, and a bilateral C4-5 posterior cervical arthrodesis with lateral mass screws and rods, and bone graft.  I've discussed with the patient the nature of his condition, the nature the surgical procedure, the typical length of surgery, hospital stay, and overall recuperation. We discussed limitations postoperatively. I discussed risks of surgery including risks of infection, bleeding, possibly need for transfusion, the risk of nerve root dysfunction with pain, weakness, numbness, or paresthesias, the risk of spinal cord dysfunction with paralysis of all 4 limbs and quadriplegia, and the risk of dural tear and CSF leakage and possible need for further surgery, the risk of failure of the arthrodesis and the possible need for further surgery, and the risk of anesthetic complications including myocardial infarction, stroke, pneumonia, and death. We also discussed the need for postoperative immobilization in a cervical collar. Understanding all this the patient does wish to proceed with surgery and is admitted for such.    Hosie Spangle, MD 04/30/2014 7:25 AM

## 2014-04-30 NOTE — Anesthesia Procedure Notes (Signed)
Procedure Name: Intubation Date/Time: 04/30/2014 9:21 AM Performed by: Julian Reil Pre-anesthesia Checklist: Patient identified, Emergency Drugs available, Suction available and Patient being monitored Patient Re-evaluated:Patient Re-evaluated prior to inductionOxygen Delivery Method: Circle system utilized Preoxygenation: Pre-oxygenation with 100% oxygen Intubation Type: IV induction Ventilation: Mask ventilation without difficulty Laryngoscope Size: Mac and 4 Grade View: Grade I Tube type: Oral Tube size: 7.5 mm Number of attempts: 1 Airway Equipment and Method: Stylet and LTA kit utilized Placement Confirmation: ETT inserted through vocal cords under direct vision,  positive ETCO2 and breath sounds checked- equal and bilateral Secured at: 23 cm Tube secured with: Tape Dental Injury: Teeth and Oropharynx as per pre-operative assessment

## 2014-04-30 NOTE — Progress Notes (Signed)
Report to [facility]

## 2014-05-01 ENCOUNTER — Encounter (HOSPITAL_COMMUNITY): Payer: Self-pay | Admitting: Neurosurgery

## 2014-05-01 MED ORDER — TAMSULOSIN HCL 0.4 MG PO CAPS
0.8000 mg | ORAL_CAPSULE | ORAL | Status: AC
  Administered 2014-05-01: 0.8 mg via ORAL
  Filled 2014-05-01: qty 2

## 2014-05-01 MED ORDER — HYDROCODONE-ACETAMINOPHEN 5-325 MG PO TABS: 1.0000 | ORAL_TABLET | ORAL | Status: DC | PRN

## 2014-05-01 MED ORDER — TAMSULOSIN HCL 0.4 MG PO CAPS
0.4000 mg | ORAL_CAPSULE | Freq: Every day | ORAL | Status: DC
  Filled 2014-05-01: qty 1

## 2014-05-01 MED ORDER — TAMSULOSIN HCL 0.4 MG PO CAPS: 0.4000 mg | ORAL_CAPSULE | Freq: Every day | ORAL | Status: DC

## 2014-05-01 NOTE — Discharge Instructions (Signed)

## 2014-05-01 NOTE — Progress Notes (Signed)
Pt doing well. Pt is voiding with PVR less than 280. Pt's dressing was changed prior to D/C per MD order. Pt given D/C instructions with Rx's, verbal understanding of teaching was given. Pt D/C'd home via wheelchair @ 1600 per MD order. Pt is stable @ D/C and has no other needs at this time. Pt D/C'd home with Aspen cervical collar per MD order. Holli Humbles, RN

## 2014-05-01 NOTE — Discharge Summary (Signed)
Physician Discharge Summary  Patient ID: Shane Beard MRN: [id number] DOB/AGE: 59/27/1957 59 y.o.  Admit date: 04/30/2014 Discharge date: 05/01/2014  Admission Diagnoses:  C4-5 nonunion/pseudoarthrosis, cervical radiculopathy   Discharge Diagnoses:  C4-5 nonunion/pseudoarthrosis, cervical radiculopathy,postoperative urinary retention  Active Problems:   Pseudoarthrosis of cervical spine   Discharged Condition: good  Hospital Course: patient was admitted underwent a right C4 C5 cervical laminotomy and foraminotomy, and a bilateral C4 C5 posterior cervical arthrodesis with lateral mass screws and rods, and bone graft. Postoperatively he had excellent relief of his radicular pain and has been up and ambulate actively in the halls.  His wound is healing nicely.  However he's had mild-to-moderate difficulty with voiding.  He is had initially moderate postvoid residuals, and was started on Flomax this morning, and his PVRs had improved.  He is anxious to be discharged home.  He has been given instructions regarding wound care and activities.  He has been given prescriptions for pain medication as well as Flomax.  He is to return for follow-up with me in 3-4 weeks.  Discharge Exam: Blood pressure 93/55, pulse 50, temperature 99 F (37.2 C), temperature source Oral, resp. rate 18, SpO2 97.00%.  Disposition: home     Medication List         Fish Oil 1000 MG Caps  Take by mouth 2 (two) times daily.     HYDROcodone-acetaminophen 5-325 MG per tablet  Commonly known as:  NORCO/VICODIN  Take 1-2 tablets by mouth every 4 (four) hours as needed for moderate pain or severe pain.     MAGNESIUM-POTASSIUM PO  Take 1 tablet by mouth 2 (two) times daily.     nabumetone 500 MG tablet  Commonly known as:  RELAFEN  Take 500 mg by mouth 2 (two) times daily.     PROBIOTIC PO  Take 2 capsules by mouth daily.     SPIRULINA PO  Take by mouth 3 (three) times daily.     tamsulosin 0.4 MG Caps  capsule  Commonly known as:  FLOMAX  Take 1 capsule (0.4 mg total) by mouth daily after breakfast.  Start taking on:  05/02/2014     Vitamin D-3 5000 UNITS Tabs  Take 5,000 Units by mouth daily.         Signed: Hosie Spangle, MD 05/01/2014, 3:23 PM

## 2014-05-01 NOTE — Progress Notes (Signed)
Filed Vitals:   04/30/14 1553 04/30/14 2000 05/01/14 0000 05/01/14 0400  BP: 117/68 101/57 100/56 97/60  Pulse: 52 50 48 48  Temp: 98.4 F (36.9 C) 97.8 F (36.6 C) 98 F (36.7 C) 97.4 F (36.3 C)  TempSrc:  Oral Oral Oral  Resp: 16 20 20 18   SpO2: 93% 98% 95% 95%    Patient up and ambulating. Comfortable. Dressing clean and dry. No radicular symptoms. Continuing to have moderate PVRs, ranging from 258-458, most recently 352.  Plan: Discussed persistent PVRs. We'll give Flomax 0.8 mg by mouth now, and begin 0.4 mg by mouth tomorrow morning. Will monitor patient's PVRs today after Flomax dose. Encouraged to ambulate.  Hosie Spangle, MD 05/01/2014, 7:37 AM

## 2015-09-18 ENCOUNTER — Other Ambulatory Visit: Payer: Self-pay | Admitting: Family Medicine

## 2015-09-18 ENCOUNTER — Ambulatory Visit
Admission: RE | Admit: 2015-09-18 | Discharge: 2015-09-18 | Disposition: A | Discharge status: Home / Self Care | Payer: Commercial Managed Care - HMO | Source: Ambulatory Visit | Attending: Family Medicine | Admitting: Family Medicine

## 2015-09-18 DIAGNOSIS — R059 Cough, unspecified: Secondary | ICD-10-CM

## 2015-09-18 DIAGNOSIS — R05 Cough: Secondary | ICD-10-CM

## 2019-06-08 ENCOUNTER — Other Ambulatory Visit: Payer: Self-pay

## 2019-06-08 DIAGNOSIS — Z20822 Contact with and (suspected) exposure to covid-19: Secondary | ICD-10-CM

## 2019-06-09 LAB — NOVEL CORONAVIRUS, NAA: SARS-CoV-2, NAA: NOT DETECTED

## 2019-10-25 DIAGNOSIS — U071 COVID-19: Secondary | ICD-10-CM | POA: Diagnosis not present

## 2020-01-16 DIAGNOSIS — Z20828 Contact with and (suspected) exposure to other viral communicable diseases: Secondary | ICD-10-CM | POA: Diagnosis not present

## 2020-02-07 DIAGNOSIS — H5210 Myopia, unspecified eye: Secondary | ICD-10-CM | POA: Diagnosis not present

## 2020-02-08 DIAGNOSIS — Z0100 Encounter for examination of eyes and vision without abnormal findings: Secondary | ICD-10-CM | POA: Diagnosis not present

## 2020-02-13 DIAGNOSIS — R69 Illness, unspecified: Secondary | ICD-10-CM | POA: Diagnosis not present

## 2020-04-15 ENCOUNTER — Ambulatory Visit
Admission: EM | Admit: 2020-04-15 | Discharge: 2020-04-15 | Disposition: A | Discharge status: Home / Self Care | Payer: Medicare HMO | Attending: Emergency Medicine | Admitting: Emergency Medicine

## 2020-04-15 ENCOUNTER — Ambulatory Visit (INDEPENDENT_AMBULATORY_CARE_PROVIDER_SITE_OTHER): Payer: Medicare HMO

## 2020-04-15 ENCOUNTER — Other Ambulatory Visit: Payer: Self-pay

## 2020-04-15 DIAGNOSIS — M545 Low back pain, unspecified: Secondary | ICD-10-CM

## 2020-04-15 MED ORDER — CYCLOBENZAPRINE HCL 10 MG PO TABS: 10.0000 mg | ORAL_TABLET | Freq: Two times a day (BID) | ORAL | 0 refills | Status: DC | PRN

## 2020-04-15 MED ORDER — PREDNISONE 10 MG (21) PO TBPK: ORAL_TABLET | ORAL | 0 refills | Status: DC

## 2020-04-15 NOTE — ED Triage Notes (Signed)
Pt presents with c/o lower back pain that radiates into left leg . Pt states began a while back but has gotten worse over past 4 days

## 2020-04-15 NOTE — ED Provider Notes (Signed)
[facility]   [id number] 04/15/20 Arrival Time: 50   Chief Complaint  Patient presents with  . Back Pain    SUBJECTIVE: History from: patient.  Shane Beard is a 64 y.o. male with history of degenerative disc disease presented to the urgent care for complaint of left lower back pain for the past 73-month.  Reports symptom has gotten worse for the past 4 days.  Denies any precipitating event.  He localizes the pain to the bilateral low back.  He describes the pain as constant and achy.  He has tried OTC medications without relief.  His symptoms are made worse with ROM.  He denies similar symptoms in the past.  .  ROS: As per HPI.  All other pertinent ROS negative.     Past Medical History:  Diagnosis Date  . Anxiety   . Depression   . Depression with anxiety   . Dizziness   . Memory difficulty 03/21/2013  . PONV (postoperative nausea and vomiting)   . Syncope and collapse 03/21/2013   Past Surgical History:  Procedure Laterality Date  . NECK SURGERY    . POSTERIOR CERVICAL FUSION/FORAMINOTOMY Right 04/30/2014   Procedure: CERVICAL FOUR FIVE POSTERIOR CERVICAL FUSION/FORAMINOTOMY LEVEL 1;  Surgeon: Hosie Spangle, MD;  Location: [facility];  Service: Neurosurgery;  Laterality: Right;  Right C45 laminotomy and foraminotomy with posterior arthrodesis with instrumentation and bonegraft  . TONSILLECTOMY    . VASECTOMY     No Known Allergies No current facility-administered medications on file prior to encounter.   Current Outpatient Medications on File Prior to Encounter  Medication Sig Dispense Refill  . Cholecalciferol (VITAMIN D-3) 5000 UNITS TABS Take 5,000 Units by mouth daily.    Marland Kitchen HYDROcodone-acetaminophen (NORCO/VICODIN) 5-325 MG per tablet Take 1-2 tablets by mouth every 4 (four) hours as needed for moderate pain or severe pain. 50 tablet 0  . MAGNESIUM-POTASSIUM PO Take 1 tablet by mouth 2 (two) times daily.    . nabumetone (RELAFEN) 500 MG tablet Take  500 mg by mouth 2 (two) times daily.    . Omega-3 Fatty Acids (FISH OIL) 1000 MG CAPS Take by mouth 2 (two) times daily.    . Probiotic Product (PROBIOTIC PO) Take 2 capsules by mouth daily.    Marland Kitchen SPIRULINA PO Take by mouth 3 (three) times daily.    . tamsulosin (FLOMAX) 0.4 MG CAPS capsule Take 1 capsule (0.4 mg total) by mouth daily after breakfast. 30 capsule 0   Social History   Socioeconomic History  . Marital status: Married    Spouse name: Keltz  . Number of children: 2  . Years of education: college  . Highest education level: Not on file  Occupational History    Employer: [employer]  Tobacco Use  . Smoking status: Former Smoker    Types: Cigarettes  . Smokeless tobacco: Never Used  . Tobacco comment: quit 26 years ago  Substance and Sexual Activity  . Alcohol use: Yes    Alcohol/week: 4.0 standard drinks    Types: 4 Standard drinks or equivalent per week    Comment: 3 or 4 beers on weekends  . Drug use: No  . Sexual activity: Not on file  Other Topics Concern  . Not on file  Social History Narrative   Patient lives at home with his wife Krejci). Patient works as Warehouse manager). Caffeine four cups daily. Right handed.    Social Determinants of Health   Financial Resource Strain:   .  Difficulty of Paying Living Expenses:   Food Insecurity:   . Worried About Charity fundraiser in the Last Year:   . Arboriculturist in the Last Year:   Transportation Needs:   . Film/video editor (Medical):   Marland Kitchen Lack of Transportation (Non-Medical):   Physical Activity:   . Days of Exercise per Week:   . Minutes of Exercise per Session:   Stress:   . Feeling of Stress :   Social Connections:   . Frequency of Communication with Friends and Family:   . Frequency of Social Gatherings with Friends and Family:   . Attends Religious Services:   . Active Member of Clubs or Organizations:   . Attends Archivist Meetings:   Marland Kitchen Marital Status:     Intimate Partner Violence:   . Fear of Current or Ex-Partner:   . Emotionally Abused:   Marland Kitchen Physically Abused:   . Sexually Abused:    Family History  Problem Relation Age of Onset  . Heart Problems Father   . Thyroid disease Sister   . High blood pressure Brother     OBJECTIVE:  Vitals:   04/15/20 1051  BP: 118/69  Pulse: (!) 58  Resp: 18  Temp: 98 F (36.7 C)  SpO2: 96%     Physical Exam Vitals and nursing note reviewed.  Constitutional:      General: He is not in acute distress.    Appearance: Normal appearance. He is normal weight. He is not ill-appearing, toxic-appearing or diaphoretic.  Cardiovascular:     Rate and Rhythm: Normal rate and regular rhythm.     Pulses: Normal pulses.     Heart sounds: Normal heart sounds. No murmur heard.  No friction rub. No gallop.   Pulmonary:     Effort: Pulmonary effort is normal. No respiratory distress.     Breath sounds: Normal breath sounds. No stridor. No wheezing, rhonchi or rales.  Chest:     Chest wall: No tenderness.  Musculoskeletal:        General: Tenderness present. No swelling or signs of injury.     Lumbar back: Spasms and tenderness present.     Comments: Back:  Patient ambulates from chair to exam table without difficulty.  Inspection: Skin clear and intact without obvious swelling, erythema, or ecchymosis. Warm to the touch  Palpation: Vertebral processes nontender. Tenderness about the left  lower back DTR: Patellar tendon reflex intact  Special Tests: Negative Straight leg raise  Neurological:     Mental Status: He is alert.     LABS:  No results found for this or any previous visit (from the past 24 hour(s)).   ASSESSMENT & PLAN:  1. Acute left-sided low back pain without sciatica     Meds ordered this encounter  Medications  . predniSONE (STERAPRED UNI-PAK 21 TAB) 10 MG (21) TBPK tablet    Sig: Take 6 tabs by mouth daily  for 1 days, then 5 tabs for 1 days, then 4 tabs for 1 days, then 3  tabs for 1 days, 2 tabs for 1 days, then 1 tab by mouth daily for 1 days    Dispense:  21 tablet    Refill:  0  . cyclobenzaprine (FLEXERIL) 10 MG tablet    Sig: Take 1 tablet (10 mg total) by mouth 2 (two) times daily as needed for muscle spasms.    Dispense:  20 tablet    Refill:  0  Discharge Instructions.   Rest, ice and heat as needed Ensure adequate ROM as tolerated. Prescribed prednisone Prescribed flexeril  for muscle spasm.  Do not drive or operate heavy machinery while taking this medication Return here or go to ER if you have any new or worsening symptoms such as numbness/tingling of the inner thighs, loss of bladder or bowel control, headache/blurry vision, nausea/vomiting, confusion/altered mental status, dizziness, weakness, passing out, imbalance, etc...    Reviewed expectations re: course of current medical issues. Questions answered. Outlined signs and symptoms indicating need for more acute intervention. Patient verbalized understanding. After Visit Summary given.       Note: This document was prepared using Dragon voice recognition software and may include unintentional dictation errors.    Emerson Monte, [place] 04/15/20 1143

## 2020-04-15 NOTE — Discharge Instructions (Addendum)
Rest, ice and heat as needed Ensure adequate ROM as tolerated. Prescribed prednisone Prescribed flexeril  for muscle spasm.  Do not drive or operate heavy machinery while taking this medication Return here or go to ER if you have any new or worsening symptoms such as numbness/tingling of the inner thighs, loss of bladder or bowel control, headache/blurry vision, nausea/vomiting, confusion/altered mental status, dizziness, weakness, passing out, imbalance, etc..Marland Kitchen

## 2020-08-28 ENCOUNTER — Emergency Department (HOSPITAL_COMMUNITY): Payer: Medicare HMO

## 2020-08-28 ENCOUNTER — Other Ambulatory Visit: Payer: Self-pay

## 2020-08-28 ENCOUNTER — Emergency Department (HOSPITAL_COMMUNITY)
Admission: EM | Admit: 2020-08-28 | Discharge: 2020-08-28 | Disposition: A | Discharge status: Home / Self Care | Payer: Medicare HMO | Attending: Emergency Medicine | Admitting: Emergency Medicine

## 2020-08-28 ENCOUNTER — Encounter (HOSPITAL_COMMUNITY): Payer: Self-pay

## 2020-08-28 DIAGNOSIS — T07XXXA Unspecified multiple injuries, initial encounter: Secondary | ICD-10-CM

## 2020-08-28 DIAGNOSIS — R410 Disorientation, unspecified: Secondary | ICD-10-CM | POA: Insufficient documentation

## 2020-08-28 DIAGNOSIS — R52 Pain, unspecified: Secondary | ICD-10-CM | POA: Diagnosis not present

## 2020-08-28 DIAGNOSIS — S199XXA Unspecified injury of neck, initial encounter: Secondary | ICD-10-CM | POA: Diagnosis not present

## 2020-08-28 DIAGNOSIS — R404 Transient alteration of awareness: Secondary | ICD-10-CM | POA: Diagnosis not present

## 2020-08-28 DIAGNOSIS — S80212A Abrasion, left knee, initial encounter: Secondary | ICD-10-CM | POA: Insufficient documentation

## 2020-08-28 DIAGNOSIS — M542 Cervicalgia: Secondary | ICD-10-CM | POA: Diagnosis not present

## 2020-08-28 DIAGNOSIS — R000 Tachycardia, unspecified: Secondary | ICD-10-CM | POA: Diagnosis not present

## 2020-08-28 DIAGNOSIS — R61 Generalized hyperhidrosis: Secondary | ICD-10-CM | POA: Insufficient documentation

## 2020-08-28 DIAGNOSIS — S4991XA Unspecified injury of right shoulder and upper arm, initial encounter: Secondary | ICD-10-CM | POA: Diagnosis not present

## 2020-08-28 DIAGNOSIS — I10 Essential (primary) hypertension: Secondary | ICD-10-CM | POA: Diagnosis not present

## 2020-08-28 DIAGNOSIS — M25511 Pain in right shoulder: Secondary | ICD-10-CM | POA: Insufficient documentation

## 2020-08-28 DIAGNOSIS — M79642 Pain in left hand: Secondary | ICD-10-CM | POA: Diagnosis not present

## 2020-08-28 DIAGNOSIS — R55 Syncope and collapse: Secondary | ICD-10-CM | POA: Insufficient documentation

## 2020-08-28 DIAGNOSIS — S70211A Abrasion, right hip, initial encounter: Secondary | ICD-10-CM | POA: Diagnosis not present

## 2020-08-28 DIAGNOSIS — Z041 Encounter for examination and observation following transport accident: Secondary | ICD-10-CM | POA: Diagnosis not present

## 2020-08-28 DIAGNOSIS — S0990XA Unspecified injury of head, initial encounter: Secondary | ICD-10-CM | POA: Insufficient documentation

## 2020-08-28 DIAGNOSIS — R42 Dizziness and giddiness: Secondary | ICD-10-CM | POA: Insufficient documentation

## 2020-08-28 LAB — CBG MONITORING, ED: Glucose-Capillary: 120 mg/dL — ABNORMAL HIGH (ref 70–99)
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
## 2020-08-28 MED ORDER — SODIUM CHLORIDE 0.9 % IV BOLUS
1000.0000 mL | Freq: Once | INTRAVENOUS | Status: AC
  Administered 2020-08-28: 1000 mL via INTRAVENOUS
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
## 2020-08-28 NOTE — Discharge Instructions (Signed)
Your work-up today showed possible left pulmonary contusion but otherwise was reassuring.  I suspect you have a concussion based on your head injury and some confusion initially.  Please rest and stay hydrated.  If any symptoms change or worsen, please return to the nearest emergency department as we discussed.

## 2020-08-28 NOTE — ED Notes (Signed)
This RN to bedside. Pt found sitting/lying at the end of the bed. Son holding pt. Son stated that the pt stood up to put his pants on, said that he was nauseated, then sat down and kinda fell back on the bed. Pt found to be diaphoretic and awake. Dr. Gustavus Messing to bedside.

## 2020-08-28 NOTE — ED Notes (Signed)
Pt and son given D/C papers. All questions answered. Pt wheeled to sons vehicle.
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
## 2020-09-20 DIAGNOSIS — Z23 Encounter for immunization: Secondary | ICD-10-CM | POA: Diagnosis not present

## 2020-09-20 DIAGNOSIS — M25519 Pain in unspecified shoulder: Secondary | ICD-10-CM | POA: Diagnosis not present

## 2020-09-23 DIAGNOSIS — R69 Illness, unspecified: Secondary | ICD-10-CM | POA: Diagnosis not present

## 2020-10-04 DIAGNOSIS — Z0000 Encounter for general adult medical examination without abnormal findings: Secondary | ICD-10-CM | POA: Diagnosis not present

## 2020-10-04 DIAGNOSIS — Z5181 Encounter for therapeutic drug level monitoring: Secondary | ICD-10-CM | POA: Diagnosis not present

## 2020-10-04 DIAGNOSIS — Z23 Encounter for immunization: Secondary | ICD-10-CM | POA: Diagnosis not present

## 2020-10-04 DIAGNOSIS — R739 Hyperglycemia, unspecified: Secondary | ICD-10-CM | POA: Diagnosis not present

## 2020-10-04 DIAGNOSIS — E782 Mixed hyperlipidemia: Secondary | ICD-10-CM | POA: Diagnosis not present

## 2020-10-19 DIAGNOSIS — Z20822 Contact with and (suspected) exposure to covid-19: Secondary | ICD-10-CM | POA: Diagnosis not present

## 2020-10-19 DIAGNOSIS — Z1152 Encounter for screening for COVID-19: Secondary | ICD-10-CM | POA: Diagnosis not present

## 2020-10-23 DIAGNOSIS — Z1152 Encounter for screening for COVID-19: Secondary | ICD-10-CM | POA: Diagnosis not present

## 2020-10-23 DIAGNOSIS — Z20822 Contact with and (suspected) exposure to covid-19: Secondary | ICD-10-CM | POA: Diagnosis not present

## 2021-07-15 DIAGNOSIS — Z0100 Encounter for examination of eyes and vision without abnormal findings: Secondary | ICD-10-CM | POA: Diagnosis not present

## 2021-08-25 ENCOUNTER — Other Ambulatory Visit: Payer: Self-pay | Admitting: Orthopedic Surgery

## 2021-08-25 DIAGNOSIS — M720 Palmar fascial fibromatosis [Dupuytren]: Secondary | ICD-10-CM | POA: Diagnosis not present

## 2021-09-09 DIAGNOSIS — Z008 Encounter for other general examination: Secondary | ICD-10-CM | POA: Diagnosis not present

## 2021-09-09 DIAGNOSIS — R030 Elevated blood-pressure reading, without diagnosis of hypertension: Secondary | ICD-10-CM | POA: Diagnosis not present

## 2021-09-09 DIAGNOSIS — Z8249 Family history of ischemic heart disease and other diseases of the circulatory system: Secondary | ICD-10-CM | POA: Diagnosis not present

## 2021-09-09 DIAGNOSIS — G8929 Other chronic pain: Secondary | ICD-10-CM | POA: Diagnosis not present

## 2021-09-09 DIAGNOSIS — K5900 Constipation, unspecified: Secondary | ICD-10-CM | POA: Diagnosis not present

## 2021-09-09 DIAGNOSIS — E785 Hyperlipidemia, unspecified: Secondary | ICD-10-CM | POA: Diagnosis not present

## 2021-09-09 DIAGNOSIS — Z9181 History of falling: Secondary | ICD-10-CM | POA: Diagnosis not present

## 2021-09-09 DIAGNOSIS — Z87891 Personal history of nicotine dependence: Secondary | ICD-10-CM | POA: Diagnosis not present

## 2021-09-22 ENCOUNTER — Encounter (HOSPITAL_BASED_OUTPATIENT_CLINIC_OR_DEPARTMENT_OTHER): Payer: Self-pay | Admitting: Orthopedic Surgery

## 2021-09-22 ENCOUNTER — Other Ambulatory Visit: Payer: Self-pay

## 2021-09-24 NOTE — Progress Notes (Signed)

## 2021-09-30 ENCOUNTER — Encounter (HOSPITAL_BASED_OUTPATIENT_CLINIC_OR_DEPARTMENT_OTHER): Payer: Self-pay | Admitting: Orthopedic Surgery

## 2021-09-30 ENCOUNTER — Ambulatory Visit (HOSPITAL_BASED_OUTPATIENT_CLINIC_OR_DEPARTMENT_OTHER): Payer: Medicare HMO | Admitting: Anesthesiology

## 2021-09-30 ENCOUNTER — Encounter (HOSPITAL_BASED_OUTPATIENT_CLINIC_OR_DEPARTMENT_OTHER)
Admission: RE | Disposition: A | Discharge status: Home / Self Care | Payer: Self-pay | Source: Home / Self Care | Attending: Orthopedic Surgery

## 2021-09-30 ENCOUNTER — Ambulatory Visit (HOSPITAL_BASED_OUTPATIENT_CLINIC_OR_DEPARTMENT_OTHER)
Admission: RE | Admit: 2021-09-30 | Discharge: 2021-09-30 | Disposition: A | Discharge status: Home / Self Care | Payer: Medicare HMO | Attending: Orthopedic Surgery | Admitting: Orthopedic Surgery

## 2021-09-30 ENCOUNTER — Other Ambulatory Visit: Payer: Self-pay

## 2021-09-30 DIAGNOSIS — F32A Depression, unspecified: Secondary | ICD-10-CM | POA: Insufficient documentation

## 2021-09-30 DIAGNOSIS — Z87891 Personal history of nicotine dependence: Secondary | ICD-10-CM | POA: Diagnosis not present

## 2021-09-30 DIAGNOSIS — M720 Palmar fascial fibromatosis [Dupuytren]: Secondary | ICD-10-CM | POA: Insufficient documentation

## 2021-09-30 DIAGNOSIS — R69 Illness, unspecified: Secondary | ICD-10-CM | POA: Diagnosis not present

## 2021-09-30 DIAGNOSIS — F419 Anxiety disorder, unspecified: Secondary | ICD-10-CM | POA: Diagnosis not present

## 2021-09-30 HISTORY — PX: FASCIECTOMY: SHX6525

## 2021-09-30 SURGERY — FASCIECTOMY, PALM
Anesthesia: General | Site: Hand | Laterality: Right

## 2021-09-30 MED ORDER — OXYCODONE HCL 5 MG PO TABS
5.0000 mg | ORAL_TABLET | Freq: Once | ORAL | Status: AC | PRN
  Administered 2021-09-30: 5 mg via ORAL

## 2021-09-30 MED ORDER — HYDROCODONE-ACETAMINOPHEN 5-325 MG PO TABS: ORAL_TABLET | ORAL | 0 refills | Status: DC

## 2021-09-30 MED ORDER — ACETAMINOPHEN 500 MG PO TABS
1000.0000 mg | ORAL_TABLET | Freq: Once | ORAL | Status: AC
  Administered 2021-09-30: 1000 mg via ORAL

## 2021-09-30 MED ORDER — EPHEDRINE SULFATE 50 MG/ML IJ SOLN
INTRAMUSCULAR | Status: DC | PRN
  Administered 2021-09-30 (×2): 5 mg via INTRAVENOUS

## 2021-09-30 MED ORDER — THROMBIN 5000 UNITS EX SOLR
CUTANEOUS | Status: AC
  Filled 2021-09-30: qty 5000

## 2021-09-30 MED ORDER — PROPOFOL 10 MG/ML IV BOLUS
INTRAVENOUS | Status: DC | PRN
  Administered 2021-09-30: 200 mg via INTRAVENOUS

## 2021-09-30 MED ORDER — LACTATED RINGERS IV SOLN: INTRAVENOUS | Status: DC

## 2021-09-30 MED ORDER — PROPOFOL 500 MG/50ML IV EMUL
INTRAVENOUS | Status: AC
  Filled 2021-09-30: qty 50

## 2021-09-30 MED ORDER — EPHEDRINE 5 MG/ML INJ
INTRAVENOUS | Status: AC
  Filled 2021-09-30: qty 5

## 2021-09-30 MED ORDER — DEXAMETHASONE SODIUM PHOSPHATE 10 MG/ML IJ SOLN
INTRAMUSCULAR | Status: AC
  Filled 2021-09-30: qty 1

## 2021-09-30 MED ORDER — LIDOCAINE HCL (CARDIAC) PF 100 MG/5ML IV SOSY
PREFILLED_SYRINGE | INTRAVENOUS | Status: DC | PRN
  Administered 2021-09-30: 60 mg via INTRAVENOUS

## 2021-09-30 MED ORDER — CEFAZOLIN SODIUM-DEXTROSE 2-4 GM/100ML-% IV SOLN
2.0000 g | INTRAVENOUS | Status: AC
  Administered 2021-09-30: 2 g via INTRAVENOUS

## 2021-09-30 MED ORDER — ONDANSETRON HCL 4 MG/2ML IJ SOLN
INTRAMUSCULAR | Status: AC
  Filled 2021-09-30: qty 2

## 2021-09-30 MED ORDER — ONDANSETRON HCL 4 MG/2ML IJ SOLN
INTRAMUSCULAR | Status: DC | PRN
  Administered 2021-09-30: 4 mg via INTRAVENOUS

## 2021-09-30 MED ORDER — BUPIVACAINE HCL (PF) 0.25 % IJ SOLN
INTRAMUSCULAR | Status: DC | PRN
  Administered 2021-09-30: 9 mL

## 2021-09-30 MED ORDER — THROMBIN 5000 UNITS EX SOLR
CUTANEOUS | Status: DC | PRN
  Administered 2021-09-30: 5000 [IU] via TOPICAL

## 2021-09-30 MED ORDER — MIDAZOLAM HCL 2 MG/2ML IJ SOLN
INTRAMUSCULAR | Status: AC
  Filled 2021-09-30: qty 2

## 2021-09-30 MED ORDER — FENTANYL CITRATE (PF) 100 MCG/2ML IJ SOLN
INTRAMUSCULAR | Status: AC
  Filled 2021-09-30: qty 2

## 2021-09-30 MED ORDER — PROMETHAZINE HCL 25 MG/ML IJ SOLN: 6.2500 mg | INTRAMUSCULAR | Status: DC | PRN

## 2021-09-30 MED ORDER — MEPERIDINE HCL 25 MG/ML IJ SOLN: 6.2500 mg | INTRAMUSCULAR | Status: DC | PRN

## 2021-09-30 MED ORDER — MIDAZOLAM HCL 5 MG/5ML IJ SOLN
INTRAMUSCULAR | Status: DC | PRN
Start: 2021-09-30 — End: 2021-09-30
  Administered 2021-09-30: 2 mg via INTRAVENOUS

## 2021-09-30 MED ORDER — OXYCODONE HCL 5 MG/5ML PO SOLN: 5.0000 mg | Freq: Once | ORAL | Status: AC | PRN

## 2021-09-30 MED ORDER — FENTANYL CITRATE (PF) 100 MCG/2ML IJ SOLN
INTRAMUSCULAR | Status: DC | PRN
  Administered 2021-09-30 (×4): 25 ug via INTRAVENOUS

## 2021-09-30 MED ORDER — 0.9 % SODIUM CHLORIDE (POUR BTL) OPTIME
TOPICAL | Status: DC | PRN
  Administered 2021-09-30: 200 mL

## 2021-09-30 MED ORDER — CEFAZOLIN SODIUM-DEXTROSE 2-4 GM/100ML-% IV SOLN
INTRAVENOUS | Status: AC
  Filled 2021-09-30: qty 100

## 2021-09-30 MED ORDER — HYDROMORPHONE HCL 1 MG/ML IJ SOLN
INTRAMUSCULAR | Status: AC
  Filled 2021-09-30: qty 0.5

## 2021-09-30 MED ORDER — AMISULPRIDE (ANTIEMETIC) 5 MG/2ML IV SOLN: 10.0000 mg | Freq: Once | INTRAVENOUS | Status: DC | PRN

## 2021-09-30 MED ORDER — DEXAMETHASONE SODIUM PHOSPHATE 10 MG/ML IJ SOLN
INTRAMUSCULAR | Status: DC | PRN
  Administered 2021-09-30: 5 mg via INTRAVENOUS

## 2021-09-30 MED ORDER — ACETAMINOPHEN 500 MG PO TABS
ORAL_TABLET | ORAL | Status: AC
  Filled 2021-09-30: qty 2

## 2021-09-30 MED ORDER — LIDOCAINE 2% (20 MG/ML) 5 ML SYRINGE
INTRAMUSCULAR | Status: AC
  Filled 2021-09-30: qty 5

## 2021-09-30 MED ORDER — OXYCODONE HCL 5 MG PO TABS
ORAL_TABLET | ORAL | Status: AC
  Filled 2021-09-30: qty 1

## 2021-09-30 MED ORDER — HYDROMORPHONE HCL 1 MG/ML IJ SOLN
0.2500 mg | INTRAMUSCULAR | Status: DC | PRN
  Administered 2021-09-30: 0.5 mg via INTRAVENOUS

## 2021-09-30 SURGICAL SUPPLY — 48 items
APL PRP STRL LF DISP 70% ISPRP (MISCELLANEOUS) ×1
BLADE MINI RND TIP GREEN BEAV (BLADE) ×1 IMPLANT
BLADE SURG 15 STRL LF DISP TIS (BLADE) ×2 IMPLANT
BLADE SURG 15 STRL SS (BLADE) ×4
BNDG CMPR 9X4 STRL LF SNTH (GAUZE/BANDAGES/DRESSINGS) ×1
BNDG ELASTIC 3X5.8 VLCR STR LF (GAUZE/BANDAGES/DRESSINGS) ×2 IMPLANT
BNDG ESMARK 4X9 LF (GAUZE/BANDAGES/DRESSINGS) ×2 IMPLANT
BNDG GAUZE ELAST 4 BULKY (GAUZE/BANDAGES/DRESSINGS) ×2 IMPLANT
CHLORAPREP W/TINT 26 (MISCELLANEOUS) ×2 IMPLANT
CORD BIPOLAR FORCEPS 12FT (ELECTRODE) ×2 IMPLANT
COVER BACK TABLE 60X90IN (DRAPES) ×2 IMPLANT
COVER MAYO STAND STRL (DRAPES) ×2 IMPLANT
CUFF TOURN SGL QUICK 18X4 (TOURNIQUET CUFF) ×1 IMPLANT
DECANTER SPIKE VIAL GLASS SM (MISCELLANEOUS) IMPLANT
DRAPE EXTREMITY T 121X128X90 (DISPOSABLE) ×2 IMPLANT
DRAPE SURG 17X23 STRL (DRAPES) ×2 IMPLANT
GAUZE SPONGE 4X4 12PLY STRL (GAUZE/BANDAGES/DRESSINGS) ×2 IMPLANT
GAUZE XEROFORM 1X8 LF (GAUZE/BANDAGES/DRESSINGS) ×2 IMPLANT
GLOVE SRG 8 PF TXTR STRL LF DI (GLOVE) ×1 IMPLANT
GLOVE SURG ENC MOIS LTX SZ7.5 (GLOVE) ×2 IMPLANT
GLOVE SURG ORTHO LTX SZ8 (GLOVE) ×1 IMPLANT
GLOVE SURG POLYISO LF SZ6.5 (GLOVE) ×1 IMPLANT
GLOVE SURG UNDER POLY LF SZ8 (GLOVE) ×4
GLOVE SURG UNDER POLY LF SZ8.5 (GLOVE) ×2 IMPLANT
GOWN STRL REUS W/ TWL LRG LVL3 (GOWN DISPOSABLE) ×1 IMPLANT
GOWN STRL REUS W/TWL LRG LVL3 (GOWN DISPOSABLE) ×2
GOWN STRL REUS W/TWL XL LVL3 (GOWN DISPOSABLE) ×4 IMPLANT
LOOP VESSEL MAXI BLUE (MISCELLANEOUS) ×2 IMPLANT
NDL HYPO 25X1 1.5 SAFETY (NEEDLE) ×1 IMPLANT
NEEDLE HYPO 25X1 1.5 SAFETY (NEEDLE) ×2 IMPLANT
NS IRRIG 1000ML POUR BTL (IV SOLUTION) ×2 IMPLANT
PACK BASIN DAY SURGERY FS (CUSTOM PROCEDURE TRAY) ×2 IMPLANT
PAD CAST 3X4 CTTN HI CHSV (CAST SUPPLIES) ×1 IMPLANT
PADDING CAST ABS 3INX4YD NS (CAST SUPPLIES) ×1
PADDING CAST ABS 4INX4YD NS (CAST SUPPLIES) ×1
PADDING CAST ABS COTTON 3X4 (CAST SUPPLIES) IMPLANT
PADDING CAST ABS COTTON 4X4 ST (CAST SUPPLIES) ×1 IMPLANT
PADDING CAST COTTON 3X4 STRL (CAST SUPPLIES) ×2
SLEEVE SCD COMPRESS KNEE MED (STOCKING) ×2 IMPLANT
SPLINT PLASTER CAST XFAST 3X15 (CAST SUPPLIES) IMPLANT
SPLINT PLASTER XTRA FASTSET 3X (CAST SUPPLIES) ×10
STOCKINETTE 4X48 STRL (DRAPES) ×2 IMPLANT
SUT ETHILON 4 0 PS 2 18 (SUTURE) ×2 IMPLANT
SUT SILK 4 0 PS 2 (SUTURE) ×1 IMPLANT
SYR BULB EAR ULCER 3OZ GRN STR (SYRINGE) ×2 IMPLANT
SYR CONTROL 10ML LL (SYRINGE) ×2 IMPLANT
TOWEL GREEN STERILE FF (TOWEL DISPOSABLE) ×4 IMPLANT
UNDERPAD 30X36 HEAVY ABSORB (UNDERPADS AND DIAPERS) ×2 IMPLANT

## 2021-09-30 NOTE — H&P (Signed)
Shane Beard is an 65 y.o. male.   Chief Complaint: dupuytrens HPI: 65 yo male with right small finger dupuytrens contracture.  It is bothersome to him.  He wishes to have right small finger fasciectomy.  Allergies: No Known Allergies  Past Medical History:  Diagnosis Date   Anxiety    Depression    Depression with anxiety    Dizziness    Memory difficulty 03/21/2013   PONV (postoperative nausea and vomiting)    Syncope and collapse 03/21/2013    Past Surgical History:  Procedure Laterality Date   Neck fusion      NECK SURGERY     POSTERIOR CERVICAL FUSION/FORAMINOTOMY Right 04/30/2014   Procedure: CERVICAL FOUR FIVE POSTERIOR CERVICAL FUSION/FORAMINOTOMY LEVEL 1;  Surgeon: Hosie Spangle, MD;  Location: [facility];  Service: Neurosurgery;  Laterality: Right;  Right C45 laminotomy and foraminotomy with posterior arthrodesis with instrumentation and bonegraft   TONSILLECTOMY     VASECTOMY      Family History: Family History  Problem Relation Age of Onset   Heart Problems Father    Thyroid disease Sister    High blood pressure Brother     Social History:   reports that he has quit smoking. His smoking use included cigarettes. He has never used smokeless tobacco. He reports that he does not currently use alcohol after a past usage of about 1.0 standard drink per week. He reports that he does not currently use drugs after having used the following drugs: Marijuana.  Medications: No medications prior to admission.    No results found for this or any previous visit (from the past 48 hour(s)).  No results found.    Height 5\' 9"  (1.753 m), weight 88.5 kg.  General appearance: alert, cooperative, and appears stated age Head: Normocephalic, without obvious abnormality, atraumatic Neck: supple, symmetrical, trachea midline Extremities: Intact sensation and capillary refill all digits.  +epl/fpl/io.  No wounds. Contracture right small finger Pulses: 2+ and  symmetric Skin: Skin color, texture, turgor normal. No rashes or lesions Neurologic: Grossly normal Incision/Wound: none  Assessment/Plan Right small finger dupuytrens contracture.  Non operative and operative treatment options have been discussed with the patient and patient wishes to proceed with operative treatment. Risks, benefits, and alternatives of surgery have been discussed and the patient agrees with the plan of care.   [name] 09/30/2021, 10:51 AM

## 2021-09-30 NOTE — Anesthesia Preprocedure Evaluation (Addendum)
Anesthesia Evaluation    Reviewed: Allergy & Precautions, Patient's Chart, lab work & pertinent test results  History of Anesthesia Complications (+) PONV and history of anesthetic complications  Airway Mallampati: II  TM Distance: >3 FB Neck ROM: Full    Dental no notable dental hx.    Pulmonary neg pulmonary ROS, former smoker,    Pulmonary exam normal breath sounds clear to auscultation       Cardiovascular negative cardio ROS Normal cardiovascular exam Rhythm:Regular Rate:Normal     Neuro/Psych Anxiety Depression negative neurological ROS     GI/Hepatic negative GI ROS, Neg liver ROS,   Endo/Other  negative endocrine ROS  Renal/GU negative Renal ROS  negative genitourinary   Musculoskeletal RIGHT SMALL FINGER DUPUYTREN'S   Abdominal   Peds  Hematology negative hematology ROS (+)   Anesthesia Other Findings Day of surgery medications reviewed with patient.  Reproductive/Obstetrics negative OB ROS                            Anesthesia Physical Anesthesia Plan  ASA: 2  Anesthesia Plan: General   Post-op Pain Management:    Induction: Intravenous  PONV Risk Score and Plan: 3 and Treatment may vary due to age or medical condition, Ondansetron, Dexamethasone and Midazolam  Airway Management Planned: LMA  Additional Equipment: None  Intra-op Plan:   Post-operative Plan: Extubation in OR  Informed Consent: I have reviewed the patients History and Physical, chart, labs and discussed the procedure including the risks, benefits and alternatives for the proposed anesthesia with the patient or authorized representative who has indicated his/her understanding and acceptance.     Dental advisory given  Plan Discussed with: CRNA  Anesthesia Plan Comments:        Anesthesia Quick Evaluation

## 2021-09-30 NOTE — Transfer of Care (Signed)
Immediate Anesthesia Transfer of Care Note  Patient: Shane Beard  Procedure(s) Performed: RIGHT SMALL FINGER FASCIECTOMY (Right: Hand)  Patient Location: PACU  Anesthesia Type:General  Level of Consciousness: awake, alert  and oriented  Airway & Oxygen Therapy: Patient Spontanous Breathing and Patient connected to face mask oxygen  Post-op Assessment: Report given to RN and Post -op Vital signs reviewed and stable  Post vital signs: Reviewed and stable  Last Vitals:  Vitals Value Taken Time  BP 138/84 09/30/21 1508  Temp    Pulse 69 09/30/21 1511  Resp 11 09/30/21 1511  SpO2 99 % 09/30/21 1511  Vitals shown include unvalidated device data.  Last Pain:  Vitals:   09/30/21 1148  TempSrc: Oral  PainSc: 0-No pain         Complications: No notable events documented.

## 2021-09-30 NOTE — Op Note (Signed)
I assisted Surgeon(s) and Role:    * [name], MD - Primary    [name], MD - Assisting on the Procedure(s): RIGHT SMALL FINGER FASCIECTOMY on 09/30/2021.  I provided assistance on this case as follows: setup, approach, identification and protection of neurovascular bundles during excision of the palmar fascia cord, closure of the incisions and application of the dressings and splint.  Electronically signed by: [name], MD Date: 09/30/2021 Time: 2:58 PM

## 2021-09-30 NOTE — Anesthesia Procedure Notes (Signed)
Procedure Name: LMA Insertion Date/Time: 09/30/2021 1:18 PM Performed by: Lavonia Dana, CRNA Pre-anesthesia Checklist: Patient identified, Emergency Drugs available, Suction available and Patient being monitored Patient Re-evaluated:Patient Re-evaluated prior to induction Oxygen Delivery Method: Circle system utilized Preoxygenation: Pre-oxygenation with 100% oxygen Induction Type: IV induction Ventilation: Mask ventilation without difficulty LMA: LMA inserted LMA Size: 5.0 Number of attempts: 1 Airway Equipment and Method: Bite block Placement Confirmation: positive ETCO2 Tube secured with: Tape Dental Injury: Teeth and Oropharynx as per pre-operative assessment

## 2021-09-30 NOTE — Discharge Instructions (Addendum)

## 2021-09-30 NOTE — Op Note (Signed)
NAME: Shane Beard MEDICAL RECORD NO: [id number] DATE OF BIRTH: [birth year] FACILITY: [facility] LOCATION: [facility] PHYSICIAN: Tennis Must, MD   OPERATIVE REPORT   DATE OF PROCEDURE: 09/30/21    PREOPERATIVE DIAGNOSIS: Right small finger Dupuytren's contracture   POSTOPERATIVE DIAGNOSIS: Right small finger Dupuytren's contracture   PROCEDURE: Right small finger and palm fasciectomy   SURGEON:  [name], M.D.   ASSISTANT: [name], MD   ANESTHESIA:  General   INTRAVENOUS FLUIDS:  Per anesthesia flow sheet.   ESTIMATED BLOOD LOSS:  Minimal.   COMPLICATIONS:  None.   SPECIMENS: Right small finger palmar fascia to pathology   TOURNIQUET TIME:    Total Tourniquet Time Documented: Upper Arm (Right) - 85 minutes Total: Upper Arm (Right) - 85 minutes    DISPOSITION:  Stable to PACU.   INDICATIONS: 65 year old male with Dupuytren's contracture right small finger.  He wishes to have fasciectomy.  Risks, benefits and alternatives of surgery were discussed including the risks of blood loss, infection, damage to nerves, vessels, tendons, ligaments, bone for surgery, need for additional surgery, complications with wound healing, continued pain, stiffness, , recurrence.  He voiced understanding of these risks and elected to proceed.  OPERATIVE COURSE:  After being identified preoperatively by myself,  the patient and I agreed on the procedure and site of the procedure.  The surgical site was marked.  Surgical consent had been signed. He was given IV antibiotics as preoperative antibiotic prophylaxis. He was transferred to the operating room and placed on the operating table in supine position with the Right upper extremity on an arm board.  General anesthesia was induced by the anesthesiologist.  Right upper extremity was prepped and draped in normal sterile orthopedic fashion.  A surgical pause was performed between the surgeons, anesthesia, and operating room  staff and all were in agreement as to the patient, procedure, and site of procedure.  Tourniquet at the proximal aspect of the extremity was inflated to 250 mmHg after exsanguination of the arm with an Esmarch bandage.  A Bruner type incision was made over the cord starting proximally in the palm.  This was carried into the subcutaneous tissues by spreading technique.  Bipolar electrocautery is used to obtain hemostasis.  The cord was identified.  It was strongly adherent to the undersurface of the skin.  This was carefully freed up.  The radial and ulnar neurovascular bundles were identified and protected throughout the case.  The neurovascular bundles were carefully traced distally and the cord sequentially released around them.  The Bruner incision was extended distally as the fasciectomy was performed.  The incision extended into the middle phalanx.  The ulnar neurovascular bundle was wrapped around a spiral cord.  There was a abductor digiti quinti cord which was also released.  The digital nerve and artery were carefully followed and the cord released around them.  The cord was removed and sent to pathology for examination.  The finger came into significantly better extension.  The swallow to the ligaments of the volar plate were released to allow better extension of the PIP joint.  The wound was copiously irrigated with sterile saline.  It was treated with thrombin.  Vessel drain was placed in the wound and the wound closed with 4-0 nylon in a horizontal mattress fashion.  It was injected with quarter percent plain Marcaine to aid in postoperative analgesia.  It was then dressed with sterile Xeroform 4 x 4's and wrapped with a Kerlix  bandage.  he tourniquet was deflated at 85 minutes.  Fingertips were are all pink with brisk capillary refill after deflation of the tourniquet.  Dorsal splint was placed for comfort and wrapped with Kerlix and Ace bandage.  The operative  drapes were broken down.  The patient  was awoken from anesthesia safely.  He was transferred back to the stretcher and taken to PACU in stable condition.  I will see him back in the office in 1 week for postoperative followup.  I will give him a prescription for Norco 5/325 1-2 tabs PO q6 hours prn pain, dispense # 20.   [name], MD Electronically signed, 09/30/21

## 2021-10-02 ENCOUNTER — Encounter (HOSPITAL_BASED_OUTPATIENT_CLINIC_OR_DEPARTMENT_OTHER): Payer: Self-pay | Admitting: Orthopedic Surgery

## 2021-10-02 LAB — SURGICAL PATHOLOGY

## 2021-10-03 NOTE — Anesthesia Postprocedure Evaluation (Signed)
Anesthesia Post Note  Patient: Shane Beard  Procedure(s) Performed: RIGHT SMALL FINGER FASCIECTOMY (Right: Hand)     Patient location during evaluation: PACU Anesthesia Type: General Level of consciousness: awake and alert Pain management: pain level controlled Vital Signs Assessment: post-procedure vital signs reviewed and stable Respiratory status: spontaneous breathing, nonlabored ventilation, respiratory function stable and patient connected to nasal cannula oxygen Cardiovascular status: blood pressure returned to baseline and stable Postop Assessment: no apparent nausea or vomiting Anesthetic complications: no   No notable events documented.  Last Vitals:  Vitals:   09/30/21 1545 09/30/21 1619  BP: (!) 151/89 (!) 168/90  Pulse: (!) 59 65  Resp: 14 20  Temp:  36.5 C  SpO2: 97% 98%    Last Pain:  Vitals:   10/01/21 0934  TempSrc:   PainSc: 0-No pain                 [place]

## 2021-10-08 DIAGNOSIS — M720 Palmar fascial fibromatosis [Dupuytren]: Secondary | ICD-10-CM | POA: Diagnosis not present

## 2021-10-08 DIAGNOSIS — Z9889 Other specified postprocedural states: Secondary | ICD-10-CM | POA: Diagnosis not present

## 2021-10-16 DIAGNOSIS — M720 Palmar fascial fibromatosis [Dupuytren]: Secondary | ICD-10-CM | POA: Diagnosis not present

## 2021-10-16 DIAGNOSIS — M25641 Stiffness of right hand, not elsewhere classified: Secondary | ICD-10-CM | POA: Diagnosis not present

## 2021-10-16 DIAGNOSIS — Z9889 Other specified postprocedural states: Secondary | ICD-10-CM | POA: Diagnosis not present

## 2021-10-23 DIAGNOSIS — M25641 Stiffness of right hand, not elsewhere classified: Secondary | ICD-10-CM | POA: Diagnosis not present

## 2021-10-23 DIAGNOSIS — Z9889 Other specified postprocedural states: Secondary | ICD-10-CM | POA: Diagnosis not present

## 2021-10-23 DIAGNOSIS — M720 Palmar fascial fibromatosis [Dupuytren]: Secondary | ICD-10-CM | POA: Diagnosis not present

## 2021-10-29 DIAGNOSIS — R52 Pain, unspecified: Secondary | ICD-10-CM | POA: Diagnosis not present

## 2021-10-29 DIAGNOSIS — Z9889 Other specified postprocedural states: Secondary | ICD-10-CM | POA: Diagnosis not present

## 2021-10-29 DIAGNOSIS — M25641 Stiffness of right hand, not elsewhere classified: Secondary | ICD-10-CM | POA: Diagnosis not present

## 2021-10-29 DIAGNOSIS — M720 Palmar fascial fibromatosis [Dupuytren]: Secondary | ICD-10-CM | POA: Diagnosis not present

## 2021-11-05 DIAGNOSIS — M25641 Stiffness of right hand, not elsewhere classified: Secondary | ICD-10-CM | POA: Diagnosis not present

## 2021-11-05 DIAGNOSIS — Z9889 Other specified postprocedural states: Secondary | ICD-10-CM | POA: Diagnosis not present

## 2021-11-12 DIAGNOSIS — M25641 Stiffness of right hand, not elsewhere classified: Secondary | ICD-10-CM | POA: Diagnosis not present

## 2021-11-12 DIAGNOSIS — M720 Palmar fascial fibromatosis [Dupuytren]: Secondary | ICD-10-CM | POA: Diagnosis not present

## 2021-11-12 DIAGNOSIS — Z9889 Other specified postprocedural states: Secondary | ICD-10-CM | POA: Diagnosis not present

## 2021-11-12 DIAGNOSIS — R52 Pain, unspecified: Secondary | ICD-10-CM | POA: Diagnosis not present

## 2021-11-18 DIAGNOSIS — Z23 Encounter for immunization: Secondary | ICD-10-CM | POA: Diagnosis not present

## 2021-11-18 DIAGNOSIS — M7989 Other specified soft tissue disorders: Secondary | ICD-10-CM | POA: Diagnosis not present

## 2021-11-18 DIAGNOSIS — Z0000 Encounter for general adult medical examination without abnormal findings: Secondary | ICD-10-CM | POA: Diagnosis not present

## 2021-11-18 DIAGNOSIS — E7800 Pure hypercholesterolemia, unspecified: Secondary | ICD-10-CM | POA: Diagnosis not present

## 2021-11-18 DIAGNOSIS — Z125 Encounter for screening for malignant neoplasm of prostate: Secondary | ICD-10-CM | POA: Diagnosis not present

## 2021-11-18 DIAGNOSIS — Z1159 Encounter for screening for other viral diseases: Secondary | ICD-10-CM | POA: Diagnosis not present

## 2021-11-18 DIAGNOSIS — Z9889 Other specified postprocedural states: Secondary | ICD-10-CM | POA: Diagnosis not present

## 2021-11-18 DIAGNOSIS — R7303 Prediabetes: Secondary | ICD-10-CM | POA: Diagnosis not present

## 2021-11-19 ENCOUNTER — Other Ambulatory Visit: Payer: Self-pay | Admitting: Family Medicine

## 2021-11-19 DIAGNOSIS — R29898 Other symptoms and signs involving the musculoskeletal system: Secondary | ICD-10-CM | POA: Diagnosis not present

## 2021-11-19 DIAGNOSIS — Z9889 Other specified postprocedural states: Secondary | ICD-10-CM | POA: Diagnosis not present

## 2021-11-19 DIAGNOSIS — M7989 Other specified soft tissue disorders: Secondary | ICD-10-CM

## 2021-11-19 DIAGNOSIS — M25641 Stiffness of right hand, not elsewhere classified: Secondary | ICD-10-CM | POA: Diagnosis not present

## 2021-11-19 DIAGNOSIS — R52 Pain, unspecified: Secondary | ICD-10-CM | POA: Diagnosis not present

## 2021-11-24 ENCOUNTER — Other Ambulatory Visit (HOSPITAL_BASED_OUTPATIENT_CLINIC_OR_DEPARTMENT_OTHER): Payer: Self-pay | Admitting: Family Medicine

## 2021-11-24 DIAGNOSIS — E7800 Pure hypercholesterolemia, unspecified: Secondary | ICD-10-CM

## 2021-12-01 ENCOUNTER — Encounter (HOSPITAL_COMMUNITY): Payer: Self-pay

## 2021-12-01 ENCOUNTER — Ambulatory Visit
Admission: RE | Admit: 2021-12-01 | Discharge: 2021-12-01 | Disposition: A | Discharge status: Home / Self Care | Payer: Medicare HMO | Source: Ambulatory Visit | Attending: Family Medicine | Admitting: Family Medicine

## 2021-12-01 DIAGNOSIS — R600 Localized edema: Secondary | ICD-10-CM | POA: Diagnosis not present

## 2021-12-01 DIAGNOSIS — M7989 Other specified soft tissue disorders: Secondary | ICD-10-CM

## 2021-12-04 ENCOUNTER — Ambulatory Visit (HOSPITAL_BASED_OUTPATIENT_CLINIC_OR_DEPARTMENT_OTHER)
Admission: RE | Admit: 2021-12-04 | Discharge: 2021-12-04 | Disposition: A | Discharge status: Home / Self Care | Payer: Medicare HMO | Source: Ambulatory Visit | Attending: Family Medicine | Admitting: Family Medicine

## 2021-12-04 ENCOUNTER — Other Ambulatory Visit: Payer: Self-pay

## 2021-12-04 DIAGNOSIS — E7800 Pure hypercholesterolemia, unspecified: Secondary | ICD-10-CM | POA: Insufficient documentation

## 2022-03-11 DIAGNOSIS — H5203 Hypermetropia, bilateral: Secondary | ICD-10-CM | POA: Diagnosis not present

## 2022-03-11 DIAGNOSIS — Z0100 Encounter for examination of eyes and vision without abnormal findings: Secondary | ICD-10-CM | POA: Diagnosis not present

## 2022-05-13 DIAGNOSIS — D125 Benign neoplasm of sigmoid colon: Secondary | ICD-10-CM | POA: Diagnosis not present

## 2022-05-13 DIAGNOSIS — Z09 Encounter for follow-up examination after completed treatment for conditions other than malignant neoplasm: Secondary | ICD-10-CM | POA: Diagnosis not present

## 2022-05-13 DIAGNOSIS — K5730 Diverticulosis of large intestine without perforation or abscess without bleeding: Secondary | ICD-10-CM | POA: Diagnosis not present

## 2022-05-13 DIAGNOSIS — D124 Benign neoplasm of descending colon: Secondary | ICD-10-CM | POA: Diagnosis not present

## 2022-05-13 DIAGNOSIS — Z86010 Personal history of colonic polyps: Secondary | ICD-10-CM | POA: Diagnosis not present

## 2022-05-13 DIAGNOSIS — K6389 Other specified diseases of intestine: Secondary | ICD-10-CM | POA: Diagnosis not present

## 2022-05-13 DIAGNOSIS — D120 Benign neoplasm of cecum: Secondary | ICD-10-CM | POA: Diagnosis not present

## 2022-05-18 DIAGNOSIS — D125 Benign neoplasm of sigmoid colon: Secondary | ICD-10-CM | POA: Diagnosis not present

## 2022-05-18 DIAGNOSIS — D120 Benign neoplasm of cecum: Secondary | ICD-10-CM | POA: Diagnosis not present

## 2022-05-18 DIAGNOSIS — D124 Benign neoplasm of descending colon: Secondary | ICD-10-CM | POA: Diagnosis not present

## 2022-05-21 DIAGNOSIS — W57XXXA Bitten or stung by nonvenomous insect and other nonvenomous arthropods, initial encounter: Secondary | ICD-10-CM | POA: Diagnosis not present

## 2022-05-21 DIAGNOSIS — R5383 Other fatigue: Secondary | ICD-10-CM | POA: Diagnosis not present

## 2022-05-21 DIAGNOSIS — R002 Palpitations: Secondary | ICD-10-CM | POA: Diagnosis not present

## 2022-05-21 DIAGNOSIS — S30861A Insect bite (nonvenomous) of abdominal wall, initial encounter: Secondary | ICD-10-CM | POA: Diagnosis not present

## 2022-11-25 DIAGNOSIS — R7303 Prediabetes: Secondary | ICD-10-CM | POA: Diagnosis not present

## 2022-11-25 DIAGNOSIS — Z136 Encounter for screening for cardiovascular disorders: Secondary | ICD-10-CM | POA: Diagnosis not present

## 2022-11-25 DIAGNOSIS — Z0000 Encounter for general adult medical examination without abnormal findings: Secondary | ICD-10-CM | POA: Diagnosis not present

## 2022-11-25 DIAGNOSIS — Z87891 Personal history of nicotine dependence: Secondary | ICD-10-CM | POA: Diagnosis not present

## 2022-11-25 DIAGNOSIS — Z125 Encounter for screening for malignant neoplasm of prostate: Secondary | ICD-10-CM | POA: Diagnosis not present

## 2022-11-25 DIAGNOSIS — Z789 Other specified health status: Secondary | ICD-10-CM | POA: Diagnosis not present

## 2022-11-25 DIAGNOSIS — Z8249 Family history of ischemic heart disease and other diseases of the circulatory system: Secondary | ICD-10-CM | POA: Diagnosis not present

## 2022-11-25 DIAGNOSIS — R918 Other nonspecific abnormal finding of lung field: Secondary | ICD-10-CM | POA: Diagnosis not present

## 2022-11-25 DIAGNOSIS — E782 Mixed hyperlipidemia: Secondary | ICD-10-CM | POA: Diagnosis not present

## 2022-11-27 ENCOUNTER — Other Ambulatory Visit: Payer: Self-pay | Admitting: Family Medicine

## 2022-11-27 DIAGNOSIS — R918 Other nonspecific abnormal finding of lung field: Secondary | ICD-10-CM

## 2022-11-27 DIAGNOSIS — R69 Illness, unspecified: Secondary | ICD-10-CM | POA: Diagnosis not present

## 2022-11-28 DIAGNOSIS — R69 Illness, unspecified: Secondary | ICD-10-CM | POA: Diagnosis not present

## 2022-11-30 ENCOUNTER — Other Ambulatory Visit: Payer: Self-pay | Admitting: Family Medicine

## 2022-11-30 DIAGNOSIS — Z136 Encounter for screening for cardiovascular disorders: Secondary | ICD-10-CM

## 2022-12-01 ENCOUNTER — Telehealth: Payer: Self-pay

## 2022-12-01 ENCOUNTER — Ambulatory Visit: Payer: Medicare HMO | Admitting: Internal Medicine

## 2022-12-01 ENCOUNTER — Encounter: Payer: Self-pay | Admitting: Internal Medicine

## 2022-12-01 VITALS — BP 133/82 | HR 74 | Resp 16 | Ht 69.0 in | Wt 197.2 lb

## 2022-12-01 DIAGNOSIS — Z789 Other specified health status: Secondary | ICD-10-CM

## 2022-12-01 DIAGNOSIS — I10 Essential (primary) hypertension: Secondary | ICD-10-CM

## 2022-12-01 DIAGNOSIS — R931 Abnormal findings on diagnostic imaging of heart and coronary circulation: Secondary | ICD-10-CM | POA: Diagnosis not present

## 2022-12-01 DIAGNOSIS — E785 Hyperlipidemia, unspecified: Secondary | ICD-10-CM | POA: Diagnosis not present

## 2022-12-01 MED ORDER — REPATHA SURECLICK 140 MG/ML ~~LOC~~ SOAJ: 1.0000 mL | SUBCUTANEOUS | 3 refills | Status: DC

## 2022-12-01 NOTE — Telephone Encounter (Signed)
LABS 4 WEEKS

## 2022-12-01 NOTE — Progress Notes (Signed)
Primary Physician/Referring:  Angelina Pih, MD  Patient ID: Shane Beard, male    DOB: Jan 09, 1956, 67 y.o.   MRN: [id number]  Chief Complaint  Patient presents with  . Hyperlipidemia  . New Patient (Initial Visit)   HPI:    Shane Beard  is a 67 y.o. ***  Past Medical History:  Diagnosis Date  . Anxiety   . Depression   . Depression with anxiety   . Dizziness   . Memory difficulty 03/21/2013  . PONV (postoperative nausea and vomiting)   . Syncope and collapse 03/21/2013   Past Surgical History:  Procedure Laterality Date  . FASCIECTOMY Right 09/30/2021   Procedure: RIGHT SMALL FINGER FASCIECTOMY;  Surgeon: [name], MD;  Location: Center;  Service: Orthopedics;  Laterality: Right;  . Neck fusion     . NECK SURGERY    . POSTERIOR CERVICAL FUSION/FORAMINOTOMY Right 04/30/2014   Procedure: CERVICAL FOUR FIVE POSTERIOR CERVICAL FUSION/FORAMINOTOMY LEVEL 1;  Surgeon: Hosie Spangle, MD;  Location: [facility];  Service: Neurosurgery;  Laterality: Right;  Right C45 laminotomy and foraminotomy with posterior arthrodesis with instrumentation and bonegraft  . TONSILLECTOMY    . VASECTOMY     Family History  Problem Relation Age of Onset  . Heart attack Mother 54  . Other Mother        Pacemaker - 2010  . Heart disease Father   . Heart Problems Father   . Thyroid disease Sister   . High blood pressure Brother     Social History   Tobacco Use  . Smoking status: Former    Packs/day: 1.00    Years: 15.00    Total pack years: 15.00    Types: Cigarettes    Quit date: 04/1987    Years since quitting: 35.6  . Smokeless tobacco: Never  . Tobacco comments:    quit 26 years ago  Substance Use Topics  . Alcohol use: Not Currently    Alcohol/week: 1.0 standard drink of alcohol    Types: 1 Cans of beer per week    Comment: 3 or 4 beers on weekends   Marital Status: Married  ROS  ***ROS Objective  Blood pressure 133/82, pulse 74, resp.  rate 16, height 5' 9"$  (1.753 m), weight 197 lb 3.2 oz (89.4 kg), SpO2 98 %. Body mass index is 29.12 kg/m.     12/01/2022    1:57 PM 09/30/2021    4:19 PM 09/30/2021    3:45 PM  Vitals with BMI  Height 5' 9"$     Weight 197 lbs 3 oz    BMI [id number]    Systolic Q000111Q [id number]  Diastolic 82 90 89  Pulse 74 65 59     ***Physical Exam  Medications and allergies   Allergies  Allergen Reactions  . Rosuvastatin Other (See Comments)     Medication list after today's encounter   Current Outpatient Medications:  .  Cholecalciferol (VITAMIN D-3) 5000 UNITS TABS, Take 5,000 Units by mouth daily., Disp: , Rfl:  .  Evolocumab (REPATHA SURECLICK) XX123456 MG/ML SOAJ, Inject 140 mg into the skin every 14 (fourteen) days., Disp: 2 mL, Rfl: 3 .  Multiple Vitamin (MULTIVITAMIN WITH MINERALS) TABS tablet, Take 1 tablet by mouth daily., Disp: , Rfl:  .  Omega-3 Fatty Acids (FISH OIL) 1000 MG CAPS, Take by mouth 2 (two) times daily., Disp: , Rfl:   Laboratory examination:   Lab Results  Component Value Date  NA 139 04/19/2014   K 4.2 04/19/2014   CO2 28 04/19/2014   GLUCOSE 92 04/19/2014   BUN 23 04/19/2014   CREATININE 1.24 04/19/2014   CALCIUM 9.1 04/19/2014   GFRNONAA 62 (L) 04/19/2014       Latest Ref Rng & Units 04/19/2014    3:22 PM 05/13/2011   10:10 AM  CMP  Glucose 70 - 99 mg/dL 92  100   BUN 6 - 23 mg/dL 23  21   Creatinine 0.50 - 1.35 mg/dL 1.24  1.05   Sodium 137 - 147 mEq/L 139  138   Potassium 3.7 - 5.3 mEq/L 4.2  5.0   Chloride 96 - 112 mEq/L 99  102   CO2 19 - 32 mEq/L 28  30   Calcium 8.4 - 10.5 mg/dL 9.1  9.4       Latest Ref Rng & Units 04/19/2014    3:22 PM 05/13/2011   10:10 AM 02/13/2010   11:17 AM  CBC  WBC 4.0 - 10.5 K/uL 5.7  4.5  5.3   Hemoglobin 13.0 - 17.0 g/dL 13.5  14.6  13.0   Hematocrit 39.0 - 52.0 % 39.1  42.6  36.5   Platelets 150 - 400 K/uL 181  195  189     Lipid Panel No results for input(s): "CHOL", "TRIG", "LDLCALC", "VLDL", "HDL", "CHOLHDL",  "LDLDIRECT" in the last 8760 hours.  HEMOGLOBIN A1C No results found for: "HGBA1C", "MPG" TSH No results for input(s): "TSH" in the last 8760 hours.  External labs:   ***  Radiology:    Cardiac Studies:   No results found for this or any previous visit from the past 1095 days.     No results found for this or any previous visit from the past 1095 days.   ***  EKG:   ***  ***  Assessment     ICD-10-CM   1. Hyperlipidemia  E78.5 EKG 12-Lead    Lipoprotein A (LPA)    Apo A1 + B + Ratio    2. Essential hypertension  I10 Lipoprotein A (LPA)    Apo A1 + B + Ratio    3. Elevated coronary artery calcium score  R93.1        Orders Placed This Encounter  Procedures  . Lipoprotein A (LPA)  . Apo A1 + B + Ratio  . EKG 12-Lead    Meds ordered this encounter  Medications  . Evolocumab (REPATHA SURECLICK) XX123456 MG/ML SOAJ    Sig: Inject 140 mg into the skin every 14 (fourteen) days.    Dispense:  2 mL    Refill:  3    Medications Discontinued During This Encounter  Medication Reason  . HYDROcodone-acetaminophen (NORCO) 5-325 MG tablet   . PARoxetine (PAXIL) 20 MG tablet   . senna-docusate (SENOKOT-S) 8.6-50 MG tablet   . ibuprofen (ADVIL) 200 MG tablet   . MAGNESIUM-POTASSIUM PO   . Probiotic Product (PROBIOTIC PO)      Recommendations:   Shane Beard is a 67 y.o.  ***    Collene Mares [place], DO, [facility]  12/01/2022, 2:21 PM Office: [phone] Pager: [phone]

## 2022-12-07 ENCOUNTER — Telehealth: Payer: Self-pay

## 2022-12-07 NOTE — Telephone Encounter (Signed)
Repatha was denied.

## 2022-12-07 NOTE — Telephone Encounter (Signed)
Can you send to [facility] for PA

## 2022-12-09 ENCOUNTER — Other Ambulatory Visit: Payer: Self-pay

## 2022-12-09 NOTE — Telephone Encounter (Signed)
Appeal was sent with new icd code

## 2022-12-09 NOTE — Telephone Encounter (Signed)
From [facility]

## 2022-12-09 NOTE — Telephone Encounter (Signed)
[facility] called to ask if we were resubmitting the prior-auth.

## 2022-12-23 ENCOUNTER — Ambulatory Visit
Admission: RE | Admit: 2022-12-23 | Discharge: 2022-12-23 | Disposition: A | Discharge status: Home / Self Care | Payer: Medicare HMO | Source: Ambulatory Visit | Attending: Family Medicine | Admitting: Family Medicine

## 2022-12-23 DIAGNOSIS — Z136 Encounter for screening for cardiovascular disorders: Secondary | ICD-10-CM

## 2022-12-23 DIAGNOSIS — Z87891 Personal history of nicotine dependence: Secondary | ICD-10-CM | POA: Diagnosis not present

## 2022-12-29 ENCOUNTER — Ambulatory Visit
Admission: RE | Admit: 2022-12-29 | Discharge: 2022-12-29 | Disposition: A | Discharge status: Home / Self Care | Payer: Medicare HMO | Source: Ambulatory Visit | Attending: Family Medicine | Admitting: Family Medicine

## 2022-12-29 DIAGNOSIS — R918 Other nonspecific abnormal finding of lung field: Secondary | ICD-10-CM

## 2022-12-29 DIAGNOSIS — J9811 Atelectasis: Secondary | ICD-10-CM | POA: Diagnosis not present

## 2022-12-29 DIAGNOSIS — E785 Hyperlipidemia, unspecified: Secondary | ICD-10-CM | POA: Diagnosis not present

## 2022-12-29 DIAGNOSIS — I10 Essential (primary) hypertension: Secondary | ICD-10-CM | POA: Diagnosis not present

## 2022-12-29 DIAGNOSIS — R911 Solitary pulmonary nodule: Secondary | ICD-10-CM | POA: Diagnosis not present

## 2022-12-29 NOTE — Telephone Encounter (Signed)
Patient aware.

## 2022-12-31 LAB — APO A1 + B + RATIO
Apolipo. B/A-1 Ratio: 0.5 ratio (ref 0.0–0.7)
Apolipoprotein A-1: 161 mg/dL (ref 101–178)
Apolipoprotein B: 82 mg/dL (ref <90)

## 2022-12-31 LAB — LIPOPROTEIN A (LPA): Lipoprotein (a): 13.1 nmol/L (ref <75.0)

## 2023-01-12 ENCOUNTER — Encounter: Payer: Self-pay | Admitting: Internal Medicine

## 2023-01-12 ENCOUNTER — Other Ambulatory Visit: Payer: Self-pay

## 2023-01-12 ENCOUNTER — Ambulatory Visit: Payer: Medicare HMO | Admitting: Internal Medicine

## 2023-01-12 VITALS — BP 121/73 | HR 50 | Ht 69.0 in | Wt 195.0 lb

## 2023-01-12 DIAGNOSIS — E785 Hyperlipidemia, unspecified: Secondary | ICD-10-CM

## 2023-01-12 DIAGNOSIS — I10 Essential (primary) hypertension: Secondary | ICD-10-CM

## 2023-01-12 DIAGNOSIS — Z789 Other specified health status: Secondary | ICD-10-CM

## 2023-01-12 MED ORDER — REPATHA SURECLICK 140 MG/ML ~~LOC~~ SOAJ: 1.0000 mL | SUBCUTANEOUS | 3 refills | Status: DC

## 2023-01-12 NOTE — Progress Notes (Signed)
Primary Physician/Referring:  Angelina Pih, MD  Patient ID: Shane Beard, male    DOB: 1956-06-09, 67 y.o.   MRN: [id number]  Chief Complaint  Patient presents with   Hyperlipidemia   Follow-up   Results   HPI:    Shane Beard  is a 67 y.o. male with past medical history significant for hyperlipidemia and statin intolerance who is here for a follow-up visit. He has been doing well since the last time he was here. No concerns or complaints today. He is tolerating Repatha without any side effects. He just got insurance approval for it so he will pick it up from the pharmacy today. I have provided him with an additional sample. Patient used to be a marathon runner and now he uses the eliptical since it is not as hard on the joints. He denies chest pain, shortness of breath, palpitations, diaphoresis, syncope.  Past Medical History:  Diagnosis Date   Anxiety    Depression    Depression with anxiety    Dizziness    Memory difficulty 03/21/2013   PONV (postoperative nausea and vomiting)    Syncope and collapse 03/21/2013   Past Surgical History:  Procedure Laterality Date   FASCIECTOMY Right 09/30/2021   Procedure: RIGHT SMALL FINGER FASCIECTOMY;  Surgeon: [name], MD;  Location: [facility];  Service: Orthopedics;  Laterality: Right;   Neck fusion      NECK SURGERY     POSTERIOR CERVICAL FUSION/FORAMINOTOMY Right 04/30/2014   Procedure: CERVICAL FOUR FIVE POSTERIOR CERVICAL FUSION/FORAMINOTOMY LEVEL 1;  Surgeon: Hosie Spangle, MD;  Location: [facility];  Service: Neurosurgery;  Laterality: Right;  Right C45 laminotomy and foraminotomy with posterior arthrodesis with instrumentation and bonegraft   TONSILLECTOMY     VASECTOMY     Family History  Problem Relation Age of Onset   Heart attack Mother 68   Other Mother        Pacemaker - 2010   Heart disease Father    Heart Problems Father    Thyroid disease Sister    High blood pressure Brother      Social History   Tobacco Use   Smoking status: Former    Packs/day: 1.00    Years: 15.00    Additional pack years: 0.00    Total pack years: 15.00    Types: Cigarettes    Quit date: 04/1987    Years since quitting: 35.7   Smokeless tobacco: Never   Tobacco comments:    quit 26 years ago  Substance Use Topics   Alcohol use: Not Currently    Alcohol/week: 1.0 standard drink of alcohol    Types: 1 Cans of beer per week    Comment: 3 or 4 beers on weekends   Marital Status: Married  ROS  Review of Systems  Cardiovascular:  Negative for chest pain, dyspnea on exertion, leg swelling, near-syncope, orthopnea, palpitations and syncope.   Objective  Blood pressure 121/73, pulse (!) 50, height 5\' 9"  (1.753 m), weight 195 lb (88.5 kg), SpO2 99 %. Body mass index is 28.8 kg/m.     01/12/2023    9:47 AM 12/01/2022    1:57 PM 09/30/2021    4:19 PM  Vitals with BMI  Height 5\' 9"  5\' 9"    Weight 195 lbs 197 lbs 3 oz   BMI 123456 [id number]   Systolic 123XX123 Q000111Q XX123456  Diastolic 73 82 90  Pulse 50 74 65     Physical Exam  Vitals reviewed.  HENT:     Head: Normocephalic and atraumatic.  Cardiovascular:     Rate and Rhythm: Normal rate and regular rhythm.     Pulses: Normal pulses.     Heart sounds: Normal heart sounds. No murmur heard. Pulmonary:     Effort: Pulmonary effort is normal.     Breath sounds: Normal breath sounds.  Abdominal:     General: Bowel sounds are normal.  Musculoskeletal:     Right lower leg: No edema.     Left lower leg: No edema.  Skin:    General: Skin is warm and dry.  Neurological:     Mental Status: He is alert.     Medications and allergies   Allergies  Allergen Reactions   Rosuvastatin Other (See Comments)     Medication list after today's encounter   Current Outpatient Medications:    Cholecalciferol (VITAMIN D-3) 5000 UNITS TABS, Take 5,000 Units by mouth daily., Disp: , Rfl:    Multiple Vitamin (MULTIVITAMIN WITH MINERALS) TABS tablet, Take  1 tablet by mouth daily., Disp: , Rfl:    Omega-3 Fatty Acids (FISH OIL) 1000 MG CAPS, Take by mouth 2 (two) times daily., Disp: , Rfl:    Evolocumab (REPATHA SURECLICK) XX123456 MG/ML SOAJ, Inject 140 mg into the skin every 14 (fourteen) days., Disp: 2 mL, Rfl: 3  Laboratory examination:   Lab Results  Component Value Date   NA 139 04/19/2014   K 4.2 04/19/2014   CO2 28 04/19/2014   GLUCOSE 92 04/19/2014   BUN 23 04/19/2014   CREATININE 1.24 04/19/2014   CALCIUM 9.1 04/19/2014   GFRNONAA 62 (L) 04/19/2014       Latest Ref Rng & Units 04/19/2014    3:22 PM 05/13/2011   10:10 AM  CMP  Glucose 70 - 99 mg/dL 92  100   BUN 6 - 23 mg/dL 23  21   Creatinine 0.50 - 1.35 mg/dL 1.24  1.05   Sodium 137 - 147 mEq/L 139  138   Potassium 3.7 - 5.3 mEq/L 4.2  5.0   Chloride 96 - 112 mEq/L 99  102   CO2 19 - 32 mEq/L 28  30   Calcium 8.4 - 10.5 mg/dL 9.1  9.4       Latest Ref Rng & Units 04/19/2014    3:22 PM 05/13/2011   10:10 AM 02/13/2010   11:17 AM  CBC  WBC 4.0 - 10.5 K/uL 5.7  4.5  5.3   Hemoglobin 13.0 - 17.0 g/dL 13.5  14.6  13.0   Hematocrit 39.0 - 52.0 % 39.1  42.6  36.5   Platelets 150 - 400 K/uL 181  195  189     Lipid Panel No results for input(s): "CHOL", "TRIG", "LDLCALC", "VLDL", "HDL", "CHOLHDL", "LDLDIRECT" in the last 8760 hours.  HEMOGLOBIN A1C No results found for: "HGBA1C", "MPG" TSH No results for input(s): "TSH" in the last 8760 hours.  External labs:  11/25/2022: Cholesterol 295 <200 mg/dL    CHOL/HDL 4.8 2.0-4.0 Ratio    HDLD 61 30-70 mg/dL Values below 40 mg/dL indicate increased risk factor  Triglyceride 85 0-199 mg/dL    NHDL 234 0-129 mg/dL Range dependent upon risk factors.  LDL Chol Calc (NIH) 220     Glucose 104 70-99 mg/dL    BUN 15 6-26 mg/dL    Creatinine 0.99 0.60-1.30 mg/dl    eGFR2021 83     eAG 120      Hgb A1c  5.8     HGB 13.3 13.0-17.0 g/dL    HCT 39.1 39.0-52.0    TSH 1.62     Radiology:    Cardiac Studies:   Coronary  Calcium Score 12/04/2021: Left main: 0 Left anterior descending artery: 14 Left circumflex artery: 1 Right coronary artery: 0 Total: 15 Percentile: 27th for age, sex, and race matched control.   EKG:   12/01/2022: Sinus Bradycardia, normal axis. No ischemia.  Assessment     ICD-10-CM   1. Hyperlipidemia, unspecified hyperlipidemia type  E78.5 Lipid Panel With LDL/HDL Ratio    2. Essential hypertension  I10     3. Statin intolerance  Z78.9        Orders Placed This Encounter  Procedures   Lipid Panel With LDL/HDL Ratio    No orders of the defined types were placed in this encounter.   There are no discontinued medications.    Recommendations:   REVIN BUTZER is a 67 y.o.  male with HLD and statin intolerance (he has failed 3 statins)   Essential hypertension Continue current cardiac medications. Encourage low-sodium diet, less than 2000 mg daily. Follow-up in 6 months or sooner if needed.  Elevated coronary artery calcium score He is tolerating repatha without issues Patient exercises regularly without chest pain or shortness of breath He used to runs marathons but now he works out on the [place] for about 45 minutes  Hyperlipidemia Statin intolerance Started on Repatha, tolerating without issues Prior auth completed, additional samples given today He has failed 3 statins, he cannot tolerate them LPA and Apo A+B within normal range     Floydene Flock, DO, [facility]  01/12/2023, 10:57 AM Office: [phone] Pager: [phone]

## 2023-02-22 ENCOUNTER — Other Ambulatory Visit: Payer: Self-pay

## 2023-02-22 MED ORDER — REPATHA SURECLICK 140 MG/ML ~~LOC~~ SOAJ: 1.0000 mL | SUBCUTANEOUS | 3 refills | Status: DC

## 2023-05-04 DIAGNOSIS — R69 Illness, unspecified: Secondary | ICD-10-CM | POA: Diagnosis not present

## 2023-05-25 DIAGNOSIS — F419 Anxiety disorder, unspecified: Secondary | ICD-10-CM | POA: Diagnosis not present

## 2023-05-25 DIAGNOSIS — Z8249 Family history of ischemic heart disease and other diseases of the circulatory system: Secondary | ICD-10-CM | POA: Diagnosis not present

## 2023-05-25 DIAGNOSIS — Z791 Long term (current) use of non-steroidal anti-inflammatories (NSAID): Secondary | ICD-10-CM | POA: Diagnosis not present

## 2023-05-25 DIAGNOSIS — I10 Essential (primary) hypertension: Secondary | ICD-10-CM | POA: Diagnosis not present

## 2023-05-25 DIAGNOSIS — Z87891 Personal history of nicotine dependence: Secondary | ICD-10-CM | POA: Diagnosis not present

## 2023-05-25 DIAGNOSIS — G8929 Other chronic pain: Secondary | ICD-10-CM | POA: Diagnosis not present

## 2023-05-25 DIAGNOSIS — Z008 Encounter for other general examination: Secondary | ICD-10-CM | POA: Diagnosis not present

## 2023-05-25 DIAGNOSIS — K5900 Constipation, unspecified: Secondary | ICD-10-CM | POA: Diagnosis not present

## 2023-05-25 DIAGNOSIS — E785 Hyperlipidemia, unspecified: Secondary | ICD-10-CM | POA: Diagnosis not present

## 2023-06-02 DIAGNOSIS — H524 Presbyopia: Secondary | ICD-10-CM | POA: Diagnosis not present

## 2023-07-15 ENCOUNTER — Ambulatory Visit: Payer: Medicare HMO | Admitting: Internal Medicine

## 2023-07-19 ENCOUNTER — Ambulatory Visit: Payer: Medicare HMO | Admitting: Cardiology

## 2023-07-23 DIAGNOSIS — E785 Hyperlipidemia, unspecified: Secondary | ICD-10-CM | POA: Diagnosis not present

## 2023-07-24 LAB — LIPID PANEL WITH LDL/HDL RATIO
Cholesterol, Total: 134 mg/dL (ref 100–199)
HDL: 56 mg/dL (ref >39)
LDL Chol Calc (NIH): 68 mg/dL (ref 0–99)
LDL/HDL Ratio: 1.2 {ratio} (ref 0.0–3.6)
Triglycerides: 42 mg/dL (ref 0–149)
VLDL Cholesterol Cal: 10 mg/dL (ref 5–40)

## 2023-08-01 NOTE — Progress Notes (Unsigned)
  Cardiology Office Note:  .   Date:  08/02/2023  ID:  Shane Beard, DOB Jan 18, 1956, MRN [id number] PCP: Carolin Coy, MD  [facility] Providers Cardiologist:  None { History of Present Illness: .   Shane Beard is a 67 y.o. male with a past medical history of hyperlipidemia and statin intolerance who is here for cardiology follow-up.  Was seen 01/12/2023 and at that time had been doing well.  No concerns or complaints at that time.  Tolerating Repatha without any side effects.  Got insurance approval so plan to pick it up from the pharmacy.  Provided with samples.  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  Denied chest pain, shortness of breath, palpitations, diaphoresis, and syncope.  Today, he tells me he is doing really well Repatha.  No chest pain or shortness of breath.  Initially when he started taking the medication he had some headaches maybe some slight cognitive issues.  It has seemed to clear up and he is unsure if this was related to the medication or not.  We reviewed his most recent lab work and suggested follow-up lipid panel in April when he sees his PCP.  Otherwise, doing well and trying to get some exercise with walking and elliptical use.  He wants to eventually get back to running.  Reports no shortness of breath nor dyspnea on exertion. Reports no chest pain, pressure, or tightness. No edema, orthopnea, PND. Reports no palpitations.    ROS: Pertinent ROS in HPI  Studies Reviewed: Marland Kitchen        Coronary Calcium Score 12/04/2021: Left main: 0 Left anterior descending artery: 14 Left circumflex artery: 1 Right coronary artery: 0 Total: 15 Percetile: 27th for age, sex, and race matched control.       Physical Exam:   VS:  BP 104/64   Pulse (!) 57   Ht 5\' 9"  (1.753 m)   Wt 191 lb 12.8 oz (87 kg)   SpO2 96%   BMI 28.32 kg/m    Wt Readings from Last 3 Encounters:  08/02/23 191 lb 12.8 oz (87 kg)  01/12/23  195 lb (88.5 kg)  12/01/22 197 lb 3.2 oz (89.4 kg)    GEN: Well nourished, well developed in no acute distress NECK: No JVD; No carotid bruits CARDIAC: RRR, no murmurs, rubs, gallops RESPIRATORY:  Clear to auscultation without rales, wheezing or rhonchi  ABDOMEN: Soft, non-tender, non-distended EXTREMITIES:  No edema; No deformity   ASSESSMENT AND PLAN: .   1.  Hyperlipidemia -Continue Repatha, some initial side effects but now none -He will be due for updated lipid panel in April when he sees his PCP -Continue fish oil 1000 mg twice daily -Last lipid panel in February of this year showed triglycerides 42, LDL 68, HDL 56, total cholesterol 409  2.  Hypertension -Blood pressure low normal today not taking any medications -We have discussed proper hydration status with 64 ounces of fluid a day as well as being a little liberal with his salt and take  3.  Statin intolerance -Doing well on Repatha with good results  4. Right foot previous injury with slight edema -Noncardiac reasoning -Continue to elevate as needed and wear compression as needed      Dispo: He can follow-up in a year with Dr. Odis Hollingshead or me  Signed, Sharlene Dory, PA-C

## 2023-08-02 ENCOUNTER — Ambulatory Visit: Payer: Medicare HMO | Attending: Internal Medicine | Admitting: Physician Assistant

## 2023-08-02 ENCOUNTER — Encounter: Payer: Self-pay | Admitting: Physician Assistant

## 2023-08-02 VITALS — BP 104/64 | HR 57 | Ht 69.0 in | Wt 191.8 lb

## 2023-08-02 DIAGNOSIS — Z789 Other specified health status: Secondary | ICD-10-CM

## 2023-08-02 DIAGNOSIS — R931 Abnormal findings on diagnostic imaging of heart and coronary circulation: Secondary | ICD-10-CM

## 2023-08-02 DIAGNOSIS — I10 Essential (primary) hypertension: Secondary | ICD-10-CM | POA: Diagnosis not present

## 2023-08-02 DIAGNOSIS — E785 Hyperlipidemia, unspecified: Secondary | ICD-10-CM

## 2023-08-02 MED ORDER — REPATHA SURECLICK 140 MG/ML ~~LOC~~ SOAJ: 1.0000 mL | SUBCUTANEOUS | 3 refills | Status: DC

## 2023-08-02 NOTE — Patient Instructions (Signed)
Medication Instructions:  Your physician recommends that you continue on your current medications as directed. Please refer to the Current Medication list given to you today.  *If you need a refill on your cardiac medications before your next appointment, please call your pharmacy*   Lab Work: None ordered  If you have labs (blood work) drawn today and your tests are completely normal, you will receive your results only by: MyChart Message (if you have MyChart) OR A paper copy in the mail If you have any lab test that is abnormal or we need to change your treatment, we will call you to review the results.   Testing/Procedures: None ordered   Follow-Up: At [facility], you and your health needs are our priority.  As part of our continuing mission to provide you with exceptional heart care, we have created designated Provider Care Teams.  These Care Teams include your primary Cardiologist (physician) and Advanced Practice Providers (APPs -  Physician Assistants and Nurse Practitioners) who all work together to provide you with the care you need, when you need it.  We recommend signing up for the patient portal called "MyChart".  Sign up information is provided on this After Visit Summary.  MyChart is used to connect with patients for Virtual Visits (Telemedicine).  Patients are able to view lab/test results, encounter notes, upcoming appointments, etc.  Non-urgent messages can be sent to your provider as well.   To learn more about what you can do with MyChart, go to [email or web address].    Your next appointment:   1 year(s)  Provider:   None  or Jari Favre, PA-C         Other Instructions

## 2023-09-29 ENCOUNTER — Other Ambulatory Visit: Payer: Self-pay | Admitting: Internal Medicine

## 2023-10-01 DIAGNOSIS — R69 Illness, unspecified: Secondary | ICD-10-CM | POA: Diagnosis not present

## 2024-02-07 DIAGNOSIS — R600 Localized edema: Secondary | ICD-10-CM | POA: Diagnosis not present

## 2024-02-07 DIAGNOSIS — R1032 Left lower quadrant pain: Secondary | ICD-10-CM | POA: Diagnosis not present

## 2024-02-07 DIAGNOSIS — R7303 Prediabetes: Secondary | ICD-10-CM | POA: Diagnosis not present

## 2024-02-07 DIAGNOSIS — E782 Mixed hyperlipidemia: Secondary | ICD-10-CM | POA: Diagnosis not present

## 2024-02-07 DIAGNOSIS — Z0000 Encounter for general adult medical examination without abnormal findings: Secondary | ICD-10-CM | POA: Diagnosis not present

## 2024-02-07 DIAGNOSIS — F419 Anxiety disorder, unspecified: Secondary | ICD-10-CM | POA: Diagnosis not present

## 2024-02-07 DIAGNOSIS — Z1331 Encounter for screening for depression: Secondary | ICD-10-CM | POA: Diagnosis not present

## 2024-02-07 DIAGNOSIS — Z125 Encounter for screening for malignant neoplasm of prostate: Secondary | ICD-10-CM | POA: Diagnosis not present

## 2024-02-07 DIAGNOSIS — R911 Solitary pulmonary nodule: Secondary | ICD-10-CM | POA: Diagnosis not present

## 2024-02-07 DIAGNOSIS — F988 Other specified behavioral and emotional disorders with onset usually occurring in childhood and adolescence: Secondary | ICD-10-CM | POA: Diagnosis not present

## 2024-02-24 ENCOUNTER — Other Ambulatory Visit: Payer: Self-pay | Admitting: Family Medicine

## 2024-02-24 DIAGNOSIS — R911 Solitary pulmonary nodule: Secondary | ICD-10-CM

## 2024-03-29 ENCOUNTER — Ambulatory Visit
Admission: RE | Admit: 2024-03-29 | Discharge: 2024-03-29 | Disposition: A | Discharge status: Home / Self Care | Payer: Medicare (Managed Care) | Source: Ambulatory Visit | Attending: Family Medicine | Admitting: Family Medicine

## 2024-03-29 DIAGNOSIS — R911 Solitary pulmonary nodule: Secondary | ICD-10-CM

## 2024-03-30 ENCOUNTER — Other Ambulatory Visit

## 2024-03-30 DIAGNOSIS — H01002 Unspecified blepharitis right lower eyelid: Secondary | ICD-10-CM | POA: Diagnosis not present

## 2024-03-30 DIAGNOSIS — H01005 Unspecified blepharitis left lower eyelid: Secondary | ICD-10-CM | POA: Diagnosis not present

## 2024-03-30 DIAGNOSIS — H16223 Keratoconjunctivitis sicca, not specified as Sjogren's, bilateral: Secondary | ICD-10-CM | POA: Diagnosis not present

## 2024-03-30 DIAGNOSIS — H01001 Unspecified blepharitis right upper eyelid: Secondary | ICD-10-CM | POA: Diagnosis not present

## 2024-03-30 DIAGNOSIS — H25813 Combined forms of age-related cataract, bilateral: Secondary | ICD-10-CM | POA: Diagnosis not present

## 2024-03-30 DIAGNOSIS — H01004 Unspecified blepharitis left upper eyelid: Secondary | ICD-10-CM | POA: Diagnosis not present

## 2024-04-26 DIAGNOSIS — E782 Mixed hyperlipidemia: Secondary | ICD-10-CM | POA: Diagnosis not present

## 2024-05-22 DIAGNOSIS — H25811 Combined forms of age-related cataract, right eye: Secondary | ICD-10-CM | POA: Diagnosis not present

## 2024-05-29 ENCOUNTER — Encounter (HOSPITAL_COMMUNITY)
Admission: RE | Admit: 2024-05-29 | Discharge: 2024-05-29 | Disposition: A | Discharge status: Home / Self Care | Payer: Medicare (Managed Care) | Source: Ambulatory Visit | Attending: Ophthalmology | Admitting: Ophthalmology

## 2024-05-31 NOTE — H&P (Signed)
 Surgical History & Physical  Patient Name: Shane Beard  DOB: [birth year]  Surgery: Cataract extraction with intraocular lens implant phacoemulsification; SITE Surgeon: Lynwood Hermann MD Surgery Date: 06/02/2024 Pre-Op Date: 03/30/2024   HPI: A 60 Yr. old male patient 1. The patient is a new patient present for a Cataract Evaluation. The patient complains of difficulty when reading fine print, books, newspaper, instructions etc., which began 3 years ago. Both eyes are affected. The episode is constant. The patient describes foggy symptoms affecting their eyes/vision. The complaint is associated with double vision. Patient has trouble driving at night. This is negatively affecting the patient's quality of life and the patient is unable to function adequately in life with the current level of vision. HPI Completed by Dr. Lynwood Hermann  Medical History: Cataracts  Review of Systems Negative Allergic/Immunologic Negative Cardiovascular Negative Constitutional Negative Ear, Nose, Mouth & Throat Negative Endocrine Negative Eyes Negative Gastrointestinal Negative Genitourinary Negative Hemotologic/Lymphatic Negative Integumentary Negative Musculoskeletal Negative Neurological Negative Psychiatry Negative Respiratory  Social Never smoked   Medication Prednisolone-moxiflox-bromfen,  Bupropion HCl, Repatha  SureClick  Sx/Procedures None  Drug Allergies  NKDA  History & Physical: Heent: cataracts NECK: supple without bruits LUNGS: lungs clear to auscultation CV: regular rate and rhythm Abdomen: soft and non-tender  Impression & Plan: Assessment: 1.  COMBINED FORMS AGE RELATED CATARACT; Both Eyes (H25.813) 2.  KERATOCONJUNCTIVITIS SICCA NOT SPECIFIED AS SJORGRENS; Both Eyes (H16.223) 3.  BLEPHARITIS; Right Upper Lid, Right Lower Lid, Left Upper Lid, Left Lower Lid (H01.001, H01.002,H01.004,H01.005) 4.  ASTIGMATISM, REGULAR; Both Eyes (H52.223)  Plan: 1.  Cataract accounts  for the patient's decreased vision. This visual impairment is not correctable with a tolerable change in glasses or contact lenses. Cataract surgery with an implantation of a new lens should significantly improve the visual and functional status of the patient.Discussed all risks, benefits, alternatives, and potential complications. Discussed the procedures and recovery. Patient desires to have surgery. A-scan ordered and performed today for intra-ocular lens calculations. The surgery will be performed in order to improve vision for driving, reading, and for eye examinations. Recommend phacoemulsification with intra-ocular lens. Recommend Dextenza  for post-operative pain and inflammation. Right Eye worse. OD first, then OS. History of refractive Surgery: None Use of Eye Pressure Lowering Drops: None Dilates well - shugarcaine or Lidocaine +Omidira by protocol Recommend Toric Lens but will reassess after treating dryness. Veteran- [employer] Waive surgeon's fee.  2.  Dryness makes it difficult to correctly measure cornea for cataract surgery. Return for cataract measurements (Auto Ks, Topo and IOL master) on testing only day to recheck measurements before surgery. Start Xiidra BID OU Preservative Free Artificial tears 1 drop 2-3x/day.  3.  Blepharitis is present - recommend regular lid cleaning.  4.  Will repeat testing after treating dry eye. May recommend toric.

## 2024-06-01 NOTE — Anesthesia Preprocedure Evaluation (Addendum)
 Anesthesia Evaluation  Patient identified by MRN, date of birth, ID band Patient awake    Reviewed: Allergy & Precautions, H&P , NPO status , Patient's Chart, lab work & pertinent test results, reviewed documented beta blocker date and time   History of Anesthesia Complications (+) PONV and history of anesthetic complications  Airway Mallampati: II  TM Distance: >3 FB Neck ROM: full    Dental  (+) Dental Advisory Given, Teeth Intact, Caps Left upper front incisor capped:   Pulmonary former smoker   Pulmonary exam normal breath sounds clear to auscultation       Cardiovascular Exercise Tolerance: Good Normal cardiovascular exam Rhythm:regular Rate:Normal  Syncope with collapse and dizziness   Neuro/Psych  PSYCHIATRIC DISORDERS Anxiety Depression    negative neurological ROS     GI/Hepatic negative GI ROS, Neg liver ROS,,,  Endo/Other  negative endocrine ROS    Renal/GU negative Renal ROS  negative genitourinary   Musculoskeletal   Abdominal   Peds  Hematology negative hematology ROS (+)   Anesthesia Other Findings   Reproductive/Obstetrics negative OB ROS                              Anesthesia Physical Anesthesia Plan  ASA: 2  Anesthesia Plan: MAC   Post-op Pain Management: Minimal or no pain anticipated   Induction:   PONV Risk Score and Plan:   Airway Management Planned: Nasal Cannula and Natural Airway  Additional Equipment: None  Intra-op Plan:   Post-operative Plan:   Informed Consent: I have reviewed the patients History and Physical, chart, labs and discussed the procedure including the risks, benefits and alternatives for the proposed anesthesia with the patient or authorized representative who has indicated his/her understanding and acceptance.     Dental Advisory Given  Plan Discussed with: CRNA  Anesthesia Plan Comments:          Anesthesia Quick  Evaluation

## 2024-06-02 ENCOUNTER — Encounter (HOSPITAL_COMMUNITY): Payer: Self-pay | Admitting: Ophthalmology

## 2024-06-02 ENCOUNTER — Ambulatory Visit (HOSPITAL_COMMUNITY): Payer: Medicare (Managed Care) | Admitting: Anesthesiology

## 2024-06-02 ENCOUNTER — Ambulatory Visit (HOSPITAL_COMMUNITY)
Admission: RE | Admit: 2024-06-02 | Discharge: 2024-06-02 | Disposition: A | Discharge status: Home / Self Care | Payer: Medicare (Managed Care) | Attending: Ophthalmology | Admitting: Ophthalmology

## 2024-06-02 ENCOUNTER — Encounter (HOSPITAL_COMMUNITY)
Admission: RE | Disposition: A | Discharge status: Home / Self Care | Payer: Self-pay | Source: Home / Self Care | Attending: Ophthalmology

## 2024-06-02 ENCOUNTER — Ambulatory Visit (HOSPITAL_BASED_OUTPATIENT_CLINIC_OR_DEPARTMENT_OTHER): Payer: Medicare (Managed Care) | Admitting: Anesthesiology

## 2024-06-02 ENCOUNTER — Other Ambulatory Visit: Payer: Self-pay

## 2024-06-02 DIAGNOSIS — H2511 Age-related nuclear cataract, right eye: Secondary | ICD-10-CM | POA: Diagnosis not present

## 2024-06-02 DIAGNOSIS — F32A Depression, unspecified: Secondary | ICD-10-CM | POA: Diagnosis not present

## 2024-06-02 DIAGNOSIS — H25811 Combined forms of age-related cataract, right eye: Secondary | ICD-10-CM | POA: Diagnosis not present

## 2024-06-02 DIAGNOSIS — H0100A Unspecified blepharitis right eye, upper and lower eyelids: Secondary | ICD-10-CM | POA: Diagnosis not present

## 2024-06-02 DIAGNOSIS — F418 Other specified anxiety disorders: Secondary | ICD-10-CM

## 2024-06-02 DIAGNOSIS — H16223 Keratoconjunctivitis sicca, not specified as Sjogren's, bilateral: Secondary | ICD-10-CM | POA: Insufficient documentation

## 2024-06-02 DIAGNOSIS — H532 Diplopia: Secondary | ICD-10-CM | POA: Diagnosis not present

## 2024-06-02 DIAGNOSIS — F419 Anxiety disorder, unspecified: Secondary | ICD-10-CM | POA: Insufficient documentation

## 2024-06-02 DIAGNOSIS — R55 Syncope and collapse: Secondary | ICD-10-CM | POA: Insufficient documentation

## 2024-06-02 DIAGNOSIS — H0100B Unspecified blepharitis left eye, upper and lower eyelids: Secondary | ICD-10-CM | POA: Insufficient documentation

## 2024-06-02 DIAGNOSIS — H5711 Ocular pain, right eye: Secondary | ICD-10-CM | POA: Diagnosis not present

## 2024-06-02 DIAGNOSIS — H52223 Regular astigmatism, bilateral: Secondary | ICD-10-CM | POA: Insufficient documentation

## 2024-06-02 DIAGNOSIS — H25813 Combined forms of age-related cataract, bilateral: Secondary | ICD-10-CM | POA: Insufficient documentation

## 2024-06-02 DIAGNOSIS — Z87891 Personal history of nicotine dependence: Secondary | ICD-10-CM | POA: Insufficient documentation

## 2024-06-02 HISTORY — PX: CATARACT EXTRACTION W/PHACO: SHX586

## 2024-06-02 HISTORY — PX: INSERTION, STENT, DRUG-ELUTING, LACRIMAL CANALICULUS: SHX7453

## 2024-06-02 SURGERY — PHACOEMULSIFICATION, CATARACT, WITH IOL INSERTION
Anesthesia: Monitor Anesthesia Care | Site: Eye | Laterality: Right

## 2024-06-02 MED ORDER — SODIUM CHLORIDE 0.9% FLUSH
INTRAVENOUS | Status: DC | PRN
  Administered 2024-06-02: 10 mL via INTRAVENOUS

## 2024-06-02 MED ORDER — PHENYLEPHRINE-KETOROLAC 1-0.3 % IO SOLN
INTRAOCULAR | Status: DC | PRN
  Administered 2024-06-02: 500 mL via OPHTHALMIC

## 2024-06-02 MED ORDER — LACTATED RINGERS IV SOLN: INTRAVENOUS | Status: DC

## 2024-06-02 MED ORDER — POVIDONE-IODINE 5 % OP SOLN
OPHTHALMIC | Status: DC | PRN
  Administered 2024-06-02: 1 via OPHTHALMIC

## 2024-06-02 MED ORDER — LIDOCAINE HCL (PF) 1 % IJ SOLN
INTRAMUSCULAR | Status: DC | PRN
  Administered 2024-06-02: 1 mL

## 2024-06-02 MED ORDER — TETRACAINE HCL 0.5 % OP SOLN
1.0000 [drp] | OPHTHALMIC | Status: AC | PRN
  Administered 2024-06-02 (×3): 1 [drp] via OPHTHALMIC

## 2024-06-02 MED ORDER — STERILE WATER FOR IRRIGATION IR SOLN
Status: DC | PRN
  Administered 2024-06-02: 250 mL

## 2024-06-02 MED ORDER — ONDANSETRON HCL 4 MG/2ML IJ SOLN
INTRAMUSCULAR | Status: DC | PRN
Start: 2024-06-02 — End: 2024-06-02
  Administered 2024-06-02: 4 mg via INTRAVENOUS

## 2024-06-02 MED ORDER — MIDAZOLAM HCL 2 MG/2ML IJ SOLN
INTRAMUSCULAR | Status: DC | PRN
  Administered 2024-06-02: 2 mg via INTRAVENOUS

## 2024-06-02 MED ORDER — SODIUM HYALURONATE 23MG/ML IO SOSY
PREFILLED_SYRINGE | INTRAOCULAR | Status: DC | PRN
  Administered 2024-06-02: .6 mL via INTRAOCULAR

## 2024-06-02 MED ORDER — SODIUM HYALURONATE 10 MG/ML IO SOLUTION
PREFILLED_SYRINGE | INTRAOCULAR | Status: DC | PRN
  Administered 2024-06-02: .85 mL via INTRAOCULAR

## 2024-06-02 MED ORDER — PHENYLEPHRINE HCL 2.5 % OP SOLN
1.0000 [drp] | OPHTHALMIC | Status: AC | PRN
  Administered 2024-06-02 (×3): 1 [drp] via OPHTHALMIC

## 2024-06-02 MED ORDER — TROPICAMIDE 1 % OP SOLN
1.0000 [drp] | OPHTHALMIC | Status: AC | PRN
  Administered 2024-06-02 (×3): 1 [drp] via OPHTHALMIC

## 2024-06-02 MED ORDER — LIDOCAINE HCL 3.5 % OP GEL
1.0000 | Freq: Once | OPHTHALMIC | Status: AC
  Administered 2024-06-02: 1 via OPHTHALMIC

## 2024-06-02 MED ORDER — BSS IO SOLN
INTRAOCULAR | Status: DC | PRN
  Administered 2024-06-02: 15 mL via INTRAOCULAR

## 2024-06-02 MED ORDER — DEXAMETHASONE 0.4 MG OP INST
VAGINAL_INSERT | OPHTHALMIC | Status: DC | PRN
  Administered 2024-06-02: .4 mg via OPHTHALMIC

## 2024-06-02 MED ORDER — MIDAZOLAM HCL 2 MG/2ML IJ SOLN
INTRAMUSCULAR | Status: AC
  Filled 2024-06-02: qty 2

## 2024-06-02 MED ORDER — MOXIFLOXACIN HCL 5 MG/ML IO SOLN
INTRAOCULAR | Status: DC | PRN
  Administered 2024-06-02: .2 mL via INTRACAMERAL

## 2024-06-02 MED ORDER — DEXAMETHASONE 0.4 MG OP INST
VAGINAL_INSERT | OPHTHALMIC | Status: AC
Start: 2024-06-02 — End: 2024-06-02
  Filled 2024-06-02: qty 1

## 2024-06-02 SURGICAL SUPPLY — 11 items
CLOTH BEACON ORANGE TIMEOUT ST (SAFETY) ×1 IMPLANT
EYE SHIELD UNIVERSAL CLEAR (GAUZE/BANDAGES/DRESSINGS) IMPLANT
FEE CATARACT SUITE SIGHTPATH (MISCELLANEOUS) ×1 IMPLANT
GLOVE BIOGEL PI IND STRL 7.0 (GLOVE) ×2 IMPLANT
LENS IOL EYHANCE TRC 150 20.0 IMPLANT
NDL HYPO 18GX1.5 BLUNT FILL (NEEDLE) ×1 IMPLANT
NEEDLE HYPO 18GX1.5 BLUNT FILL (NEEDLE) ×1 IMPLANT
PAD ARMBOARD POSITIONER FOAM (MISCELLANEOUS) ×1 IMPLANT
SYR TB 1ML LL NO SAFETY (SYRINGE) ×1 IMPLANT
TAPE SURG TRANSPORE 1 IN (GAUZE/BANDAGES/DRESSINGS) IMPLANT
WATER STERILE IRR 250ML POUR (IV SOLUTION) ×1 IMPLANT

## 2024-06-02 NOTE — Discharge Instructions (Signed)
 Please discharge patient when stable, will follow up today with Dr. June Leap at the [facility] office immediately following discharge.  Leave shield in place until visit.  All paperwork with discharge instructions will be given at the office.  [facility] Address:  [address]  [place], [place] [address]

## 2024-06-02 NOTE — Op Note (Signed)
 Date of procedure: 06/02/24  Pre-operative diagnosis: Visually significant age-related nuclear cataract, Right Eye; Visually Significant Astigmatism, Right  Eye (H25.11)  Post-operative diagnosis:  Visually significant age-related nuclear cataract, Right Eye Visually Significant Astigmatism, Right Eye Pain and inflammation after cataract surgery, Right eye  Procedure:  Removal of cataract via phacoemulsification and insertion of intra-ocular lens Vicci and Johnson DIU150 +20.0D into the capsular bag of the Right Eye Placement of Dextenza  insert, Right lower eyelid  Attending surgeon: Lynwood LABOR. Sho Salguero, MD, MA  Anesthesia: MAC, Topical Akten  Complications: None  Estimated Blood Loss: <72mL (minimal)  Specimens: None  Implants: As above  Indications:  Visually significant age-related cataract, Right Eye; Visually Significant Astigmatism, Right Eye  Procedure:  The patient was seen and identified in the pre-operative area. The operative eye was identified and dilated.  The operative eye was marked.  Pre-operative toric markers were used to mark the eye at 0 and 180 degrees. Topical anesthesia was administered to the operative eye.     The patient was then to the operative suite and placed in the supine position.  A timeout was performed confirming the patient, procedure to be performed, and all other relevant information.   The patient's face was prepped and draped in the usual fashion for intra-ocular surgery.  A lid speculum was placed into the operative eye and the surgical microscope moved into place and focused.  A superotemporal paracentesis was created using a 20 gauge paracentesis blade. Omidria  was injected into the anterior chamber. Shugarcaine was injected into the anterior chamber.  Viscoelastic was injected into the anterior chamber.  A temporal clear-corneal main wound incision was created using a 2.86mm microkeratome.  A continuous curvilinear capsulorrhexis was initiated  using an irrigating cystitome and completed using capsulorrhexis forceps.  Hydrodissection and hydrodeliniation were performed.  Viscoelastic was injected into the anterior chamber.  A phacoemulsification handpiece and a chopper as a second instrument were used to remove the nucleus and epinucleus. The irrigation/aspiration handpiece was used to remove any remaining cortical material.   The capsular bag was reinflated with viscoelastic, checked, and found to be intact.  The eye was marked to the per-op meridian.  The intraocular lens was inserted into the capsular bag and dialed into place using a Kuglen hook to 21 degrees.  The irrigation/aspiration handpiece was used to remove any remaining viscoelastic.  The clear corneal wound and paracentesis wounds were then hydrated and checked with Weck-Cels to be watertight. 0.1mL of moxifloxacin  was injected into the anterior chamber. The lid-speculum was removed.    The lower punctum was dilated and filled with Provisc. A Dextenza  implant was placed in the lower canaliculus without complication.  The drape was removed. The patient's face was cleaned with a wet and dry 4x4.   A clear shield was taped over the eye. The patient was taken to the post-operative care unit in good condition, having tolerated the procedure well.  Post-Op Instructions: The patient will follow up at [facility] for a same day post-operative evaluation and will receive all other orders and instructions.

## 2024-06-02 NOTE — Interval H&P Note (Signed)
 History and Physical Interval Note:  06/02/2024 8:02 AM  Shane Beard Side  has presented today for surgery, with the diagnosis of combined forms age related cataract, right eye.  The various methods of treatment have been discussed with the patient and family. After consideration of risks, benefits and other options for treatment, the patient has consented to  Procedure(s) with comments: PHACOEMULSIFICATION, CATARACT, WITH IOL INSERTION (Right) - CDE: INSERTION, STENT, DRUG-ELUTING, LACRIMAL CANALICULUS (Right) as a surgical intervention.  The patient's history has been reviewed, patient examined, no change in status, stable for surgery.  I have reviewed the patient's chart and labs.  Questions were answered to the patient's satisfaction.     HARRIE AGENT

## 2024-06-02 NOTE — Anesthesia Postprocedure Evaluation (Signed)
 Anesthesia Post Note  Patient: Shane Beard  Procedure(s) Performed: PHACOEMULSIFICATION, CATARACT, WITH IOL INSERTION (Right: Eye) INSERTION, STENT, DRUG-ELUTING, LACRIMAL CANALICULUS (Right: Eye)  Patient location during evaluation: Phase II Anesthesia Type: MAC Level of consciousness: awake and alert Pain management: pain level controlled Vital Signs Assessment: post-procedure vital signs reviewed and stable Respiratory status: spontaneous breathing, nonlabored ventilation and respiratory function stable Cardiovascular status: stable and blood pressure returned to baseline Postop Assessment: no apparent nausea or vomiting Anesthetic complications: no   There were no known notable events for this encounter.   Last Vitals:  Vitals:   06/02/24 0829 06/02/24 0832  BP:  110/79  Pulse: (!) 49   Resp: 20   Temp: 36.6 C   SpO2: 100%     Last Pain:  Vitals:   06/02/24 0829  TempSrc: Oral  PainSc: 0-No pain                 Eythan Jayne L Aviraj Kentner

## 2024-06-02 NOTE — Transfer of Care (Signed)
 Immediate Anesthesia Transfer of Care Note  Patient: Shane Beard  Procedure(s) Performed: PHACOEMULSIFICATION, CATARACT, WITH IOL INSERTION (Right: Eye) INSERTION, STENT, DRUG-ELUTING, LACRIMAL CANALICULUS (Right: Eye)  Patient Location: Short Stay  Anesthesia Type:MAC  Level of Consciousness: awake, alert , oriented, and patient cooperative  Airway & Oxygen Therapy: Patient Spontanous Breathing  Post-op Assessment: Report given to RN, Post -op Vital signs reviewed and stable, and Patient moving all extremities X 4  Post vital signs: Reviewed and stable  Last Vitals:  Vitals Value Taken Time  BP 110/79 06/02/24 08:32  Temp 36.6 C 06/02/24 08:29  Pulse 49 06/02/24 08:29  Resp 20 06/02/24 08:29  SpO2 100 % 06/02/24 08:29    Last Pain:  Vitals:   06/02/24 0829  TempSrc: Oral  PainSc: 0-No pain         Complications: No notable events documented.

## 2024-06-05 ENCOUNTER — Encounter (HOSPITAL_COMMUNITY): Payer: Self-pay | Admitting: Ophthalmology

## 2024-06-12 DIAGNOSIS — H25812 Combined forms of age-related cataract, left eye: Secondary | ICD-10-CM | POA: Diagnosis not present

## 2024-06-14 ENCOUNTER — Other Ambulatory Visit (HOSPITAL_COMMUNITY): Payer: Medicare (Managed Care)

## 2024-06-21 NOTE — H&P (Signed)
 Surgical History & Physical  Patient Name: Shane Beard  DOB: [birth year]  Surgery: Cataract extraction with intraocular lens implant phacoemulsification; Left Eye Surgeon: Shane Hermann MD Surgery Date: 06/26/2024 Pre-Op Date: 06/05/2024  HPI: A 37 Yr. old male patient 1. The patient is returning after cataract surgery. The right eye is affected. Status post cataract surgery, which began 3 days ago: Since the last visit, the affected area feels improvement. The patient's vision is improved. The condition's severity is constant. Patient is following medication instructions. 2. The patient is returning for a cataract follow-up of the left eye. Since the last visit, the affected area is tolerating. The patient's vision is blurry. The condition's severity is constant. Patient is not taking medications. This is negatively affecting the patient's quality of life and the patient is unable to function adequately in life with the current level of vision. HPI Completed by Dr. Lynwood Beard  Medical History: Cataracts  Review of Systems Negative Allergic/Immunologic Negative Cardiovascular Negative Constitutional Negative Ear, Nose, Mouth & Throat Negative Endocrine Negative Eyes Negative Gastrointestinal Negative Genitourinary Negative Hemotologic/Lymphatic Negative Integumentary Negative Musculoskeletal Negative Neurological Negative Psychiatry Negative Respiratory  Social Never smoked  Medication Prednisolone-moxiflox-bromfen,  Bupropion HCl, Repatha  SureClick  Sx/Procedures Phaco c IOL OD-dextenza  - toric  Drug Allergies  NKDA  History & Physical: Heent: cataract NECK: supple without bruits LUNGS: lungs clear to auscultation CV: regular rate and rhythm Abdomen: soft and non-tender  Impression & Plan: Assessment: 1.  CATARACT EXTRACTION STATUS; Right Eye (Z98.41) 2.  COMBINED FORMS AGE RELATED CATARACT; Left Eye (H25.812) 3.  ASTIGMATISM, REGULAR; Both Eyes  (H52.223)  Plan: 1.  3 days after cataract surgery. Doing well with improved vision and normal eye pressure. Call with any problems or concerns. Continue Pred-Moxi-Brom 3x/day for 4 days, then 2x/day for 1 more week.,  2.  Cataract accounts for the patient's decreased vision. This visual impairment is not correctable with a tolerable change in glasses or contact lenses. Cataract surgery with an implantation of a new lens should significantly improve the visual and functional status of the patient.Discussed all risks, benefits, alternatives, and potential complications. Discussed the procedures and recovery. Patient desires to have surgery. A-scan ordered and performed today for intra-ocular lens calculations. The surgery will be performed in order to improve vision for driving, reading, and for eye examinations. Recommend phacoemulsification with intra-ocular lens. Recommend Dextenza  for post-operative pain and inflammation. Left Eye. History of refractive Surgery: None Use of Eye Pressure Lowering Drops: None Dilates well - shugarcaine or Lidocaine +Omidira by protocol Recommend Toric Lens [employer] for toric IOL fee.  3.  recommend toric.

## 2024-06-22 ENCOUNTER — Encounter (HOSPITAL_COMMUNITY)
Admission: RE | Admit: 2024-06-22 | Discharge: 2024-06-22 | Disposition: A | Discharge status: Home / Self Care | Payer: Medicare (Managed Care) | Source: Ambulatory Visit | Attending: Ophthalmology | Admitting: Ophthalmology

## 2024-06-22 ENCOUNTER — Encounter (HOSPITAL_COMMUNITY): Payer: Self-pay

## 2024-06-26 ENCOUNTER — Ambulatory Visit (HOSPITAL_COMMUNITY): Payer: Medicare (Managed Care)

## 2024-06-26 ENCOUNTER — Encounter (HOSPITAL_COMMUNITY)
Admission: RE | Disposition: A | Discharge status: Home / Self Care | Payer: Self-pay | Source: Home / Self Care | Attending: Ophthalmology

## 2024-06-26 ENCOUNTER — Other Ambulatory Visit: Payer: Self-pay

## 2024-06-26 ENCOUNTER — Encounter (HOSPITAL_COMMUNITY): Payer: Self-pay | Admitting: Ophthalmology

## 2024-06-26 ENCOUNTER — Ambulatory Visit (HOSPITAL_BASED_OUTPATIENT_CLINIC_OR_DEPARTMENT_OTHER): Payer: Medicare (Managed Care)

## 2024-06-26 ENCOUNTER — Ambulatory Visit (HOSPITAL_COMMUNITY)
Admission: RE | Admit: 2024-06-26 | Discharge: 2024-06-26 | Disposition: A | Discharge status: Home / Self Care | Payer: Medicare (Managed Care) | Attending: Ophthalmology | Admitting: Ophthalmology

## 2024-06-26 DIAGNOSIS — H25812 Combined forms of age-related cataract, left eye: Secondary | ICD-10-CM | POA: Diagnosis not present

## 2024-06-26 DIAGNOSIS — H5712 Ocular pain, left eye: Secondary | ICD-10-CM | POA: Diagnosis not present

## 2024-06-26 DIAGNOSIS — F418 Other specified anxiety disorders: Secondary | ICD-10-CM | POA: Diagnosis not present

## 2024-06-26 DIAGNOSIS — H52202 Unspecified astigmatism, left eye: Secondary | ICD-10-CM

## 2024-06-26 DIAGNOSIS — F419 Anxiety disorder, unspecified: Secondary | ICD-10-CM | POA: Diagnosis not present

## 2024-06-26 DIAGNOSIS — Z87891 Personal history of nicotine dependence: Secondary | ICD-10-CM | POA: Diagnosis not present

## 2024-06-26 HISTORY — PX: CATARACT EXTRACTION W/PHACO: SHX586

## 2024-06-26 HISTORY — PX: INSERTION, STENT, DRUG-ELUTING, LACRIMAL CANALICULUS: SHX7453

## 2024-06-26 SURGERY — PHACOEMULSIFICATION, CATARACT, WITH IOL INSERTION
Anesthesia: Monitor Anesthesia Care | Site: Eye | Laterality: Left

## 2024-06-26 MED ORDER — TETRACAINE HCL 0.5 % OP SOLN
1.0000 [drp] | OPHTHALMIC | Status: AC | PRN
  Administered 2024-06-26 (×3): 1 [drp] via OPHTHALMIC

## 2024-06-26 MED ORDER — DEXAMETHASONE 0.4 MG OP INST
VAGINAL_INSERT | OPHTHALMIC | Status: DC | PRN
  Administered 2024-06-26: .4 mg via OPHTHALMIC

## 2024-06-26 MED ORDER — MIDAZOLAM HCL 2 MG/2ML IJ SOLN
INTRAMUSCULAR | Status: DC | PRN
  Administered 2024-06-26: 2 mg via INTRAVENOUS

## 2024-06-26 MED ORDER — DEXAMETHASONE 0.4 MG OP INST
VAGINAL_INSERT | OPHTHALMIC | Status: AC
  Filled 2024-06-26: qty 1

## 2024-06-26 MED ORDER — STERILE WATER FOR IRRIGATION IR SOLN
Status: DC | PRN
  Administered 2024-06-26: 200 mL

## 2024-06-26 MED ORDER — POVIDONE-IODINE 5 % OP SOLN
OPHTHALMIC | Status: DC | PRN
  Administered 2024-06-26: 1 via OPHTHALMIC

## 2024-06-26 MED ORDER — LACTATED RINGERS IV SOLN: INTRAVENOUS | Status: DC

## 2024-06-26 MED ORDER — MOXIFLOXACIN HCL 5 MG/ML IO SOLN
INTRAOCULAR | Status: DC | PRN
  Administered 2024-06-26: .2 mL via INTRACAMERAL

## 2024-06-26 MED ORDER — LIDOCAINE HCL 3.5 % OP GEL: 1.0000 | Freq: Once | OPHTHALMIC | Status: DC

## 2024-06-26 MED ORDER — TROPICAMIDE 1 % OP SOLN
1.0000 [drp] | OPHTHALMIC | Status: AC | PRN
  Administered 2024-06-26 (×3): 1 [drp] via OPHTHALMIC

## 2024-06-26 MED ORDER — SODIUM HYALURONATE 23MG/ML IO SOSY
PREFILLED_SYRINGE | INTRAOCULAR | Status: DC | PRN
  Administered 2024-06-26: .6 mL via INTRAOCULAR

## 2024-06-26 MED ORDER — PHENYLEPHRINE-KETOROLAC 1-0.3 % IO SOLN
INTRAOCULAR | Status: DC | PRN
  Administered 2024-06-26: 500 mL via OPHTHALMIC

## 2024-06-26 MED ORDER — LIDOCAINE HCL (PF) 1 % IJ SOLN
INTRAMUSCULAR | Status: DC | PRN
  Administered 2024-06-26: 1 mL

## 2024-06-26 MED ORDER — PHENYLEPHRINE HCL 2.5 % OP SOLN
1.0000 [drp] | OPHTHALMIC | Status: AC | PRN
  Administered 2024-06-26 (×3): 1 [drp] via OPHTHALMIC

## 2024-06-26 MED ORDER — MIDAZOLAM HCL 2 MG/2ML IJ SOLN
INTRAMUSCULAR | Status: AC
  Filled 2024-06-26: qty 2

## 2024-06-26 MED ORDER — BSS IO SOLN
INTRAOCULAR | Status: DC | PRN
  Administered 2024-06-26: 15 mL via INTRAOCULAR

## 2024-06-26 MED ORDER — SODIUM HYALURONATE 10 MG/ML IO SOLUTION
PREFILLED_SYRINGE | INTRAOCULAR | Status: DC | PRN
  Administered 2024-06-26: .85 mL via INTRAOCULAR

## 2024-06-26 SURGICAL SUPPLY — 11 items
CLOTH BEACON ORANGE TIMEOUT ST (SAFETY) ×1 IMPLANT
EYE SHIELD UNIVERSAL CLEAR (GAUZE/BANDAGES/DRESSINGS) IMPLANT
FEE CATARACT SUITE SIGHTPATH (MISCELLANEOUS) ×1 IMPLANT
GLOVE BIOGEL PI IND STRL 7.0 (GLOVE) ×2 IMPLANT
LENS IOL EYHANCE TRC 150 20.0 IMPLANT
NDL HYPO 18GX1.5 BLUNT FILL (NEEDLE) ×1 IMPLANT
NEEDLE HYPO 18GX1.5 BLUNT FILL (NEEDLE) ×1 IMPLANT
PAD ARMBOARD POSITIONER FOAM (MISCELLANEOUS) ×1 IMPLANT
SYR TB 1ML LL NO SAFETY (SYRINGE) ×1 IMPLANT
TAPE SURG TRANSPORE 1 IN (GAUZE/BANDAGES/DRESSINGS) IMPLANT
WATER STERILE IRR 250ML POUR (IV SOLUTION) ×1 IMPLANT

## 2024-06-26 NOTE — Transfer of Care (Signed)
 Immediate Anesthesia Transfer of Care Note  Patient: Shane Beard  Procedure(s) Performed: PHACOEMULSIFICATION, CATARACT, WITH IOL INSERTION (Left: Eye) INSERTION, STENT, DRUG-ELUTING, LACRIMAL CANALICULUS (Left: Eye)  Patient Location: PACU and Short Stay  Anesthesia Type:MAC  Level of Consciousness: awake, alert , and oriented  Airway & Oxygen Therapy: Patient Spontanous Breathing  Post-op Assessment: Report given to RN and Post -op Vital signs reviewed and stable  Post vital signs: Reviewed and stable  Last Vitals:  Vitals Value Taken Time  BP 137/79 06/26/24 11:40  Temp 36.5 C 06/26/24 11:40  Pulse 45 06/26/24 11:40  Resp 16 06/26/24 11:40  SpO2 100 % 06/26/24 11:40    Last Pain:  Vitals:   06/26/24 1140  TempSrc: Oral  PainSc: 0-No pain      Patients Stated Pain Goal: 5 (06/26/24 1140)  Complications: No notable events documented.

## 2024-06-26 NOTE — Anesthesia Preprocedure Evaluation (Addendum)
 Anesthesia Evaluation  Patient identified by MRN, date of birth, ID band Patient awake    Reviewed: Allergy & Precautions, H&P , NPO status , Patient's Chart, lab work & pertinent test results  History of Anesthesia Complications (+) PONV and history of anesthetic complications  Airway Mallampati: II  TM Distance: >3 FB Neck ROM: Full    Dental no notable dental hx.    Pulmonary former smoker   Pulmonary exam normal breath sounds clear to auscultation       Cardiovascular negative cardio ROS Normal cardiovascular exam Rhythm:Regular Rate:Normal     Neuro/Psych  PSYCHIATRIC DISORDERS Anxiety Depression    negative neurological ROS     GI/Hepatic negative GI ROS, Neg liver ROS,,,  Endo/Other  negative endocrine ROS    Renal/GU negative Renal ROS  negative genitourinary   Musculoskeletal negative musculoskeletal ROS (+)    Abdominal   Peds negative pediatric ROS (+)  Hematology negative hematology ROS (+)   Anesthesia Other Findings   Reproductive/Obstetrics negative OB ROS                              Anesthesia Physical Anesthesia Plan  ASA: 1  Anesthesia Plan: MAC   Post-op Pain Management:    Induction:   PONV Risk Score and Plan:   Airway Management Planned: Nasal Cannula  Additional Equipment:   Intra-op Plan:   Post-operative Plan:   Informed Consent: I have reviewed the patients History and Physical, chart, labs and discussed the procedure including the risks, benefits and alternatives for the proposed anesthesia with the patient or authorized representative who has indicated his/her understanding and acceptance.     Dental advisory given  Plan Discussed with: CRNA  Anesthesia Plan Comments:         Anesthesia Quick Evaluation

## 2024-06-26 NOTE — Interval H&P Note (Signed)
 History and Physical Interval Note:  06/26/2024 11:06 AM  Shane Beard Side  has presented today for surgery, with the diagnosis of combined forms age related cataract, left eye.  The various methods of treatment have been discussed with the patient and family. After consideration of risks, benefits and other options for treatment, the patient has consented to  Procedure(s) with comments: PHACOEMULSIFICATION, CATARACT, WITH IOL INSERTION (Left) - CDE: INSERTION, STENT, DRUG-ELUTING, LACRIMAL CANALICULUS (Left) as a surgical intervention.  The patient's history has been reviewed, patient examined, no change in status, stable for surgery.  I have reviewed the patient's chart and labs.  Questions were answered to the patient's satisfaction.     HARRIE AGENT

## 2024-06-26 NOTE — Op Note (Addendum)
 Date of procedure: 06/26/24  Pre-operative diagnosis: Visually significant age-related combined cataract, Left Eye; Visually Significant Astigmatism, Left Eye (H25.812)  Post-operative diagnosis:  Visually significant age-related cataract, Left Eye Visually Significant Astigmatism, Left Eye Pain and inflammation after cataract surgery, left eye  Procedure:  Removal of cataract via phacoemulsification and insertion of intra-ocular lens Vicci and Johnson DIU150 +20.0D into the capsular bag of the Left Eye Placement of Dextenza  insert, left lower eyelid  Attending surgeon: Lynwood LABOR. Truth Barot, MD, MA  Anesthesia: MAC, Topical Akten  Complications: None  Estimated Blood Loss: <42mL (minimal)  Specimens: None  Implants: As above  Indications:  Visually significant age-related cataract, Left Eye; Visually Significant Astigmatism, Left Eye  Procedure:  The patient was seen and identified in the pre-operative area. The operative eye was identified and dilated.  The operative eye was marked.  Pre-operative toric markers were used to mark the eye at 0 and 180 degrees. Topical anesthesia was administered to the operative eye.     The patient was then to the operative suite and placed in the supine position.  A timeout was performed confirming the patient, procedure to be performed, and all other relevant information.   The patient's face was prepped and draped in the usual fashion for intra-ocular surgery.  A lid speculum was placed into the operative eye and the surgical microscope moved into place and focused.  A superotemporal paracentesis was created using a 20 gauge paracentesis blade. Omidria  was injected into the anterior chamber. Shugarcaine was injected into the anterior chamber.  Viscoelastic was injected into the anterior chamber.  A temporal clear-corneal main wound incision was created using a 2.56mm microkeratome.  A continuous curvilinear capsulorrhexis was initiated using an  irrigating cystitome and completed using capsulorrhexis forceps.  Hydrodissection and hydrodeliniation were performed.  Viscoelastic was injected into the anterior chamber.  A phacoemulsification handpiece and a chopper as a second instrument were used to remove the nucleus and epinucleus. The irrigation/aspiration handpiece was used to remove any remaining cortical material.   The capsular bag was reinflated with viscoelastic, checked, and found to be intact.  The eye was marked to the per-op meridian.  The intraocular lens was inserted into the capsular bag and dialed into place using a Kuglen hook to 169 degrees.  The irrigation/aspiration handpiece was used to remove any remaining viscoelastic.  The clear corneal wound and paracentesis wounds were then hydrated and checked with Weck-Cels to be watertight. 0.1mL of moxifloxacin  was injected into the anterior chamber. The lid-speculum was removed.    The lower punctum was dilated and filled with Provisc. A Dextenza  implant was placed in the lower canaliculus without complication.  The drape was removed. The patient's face was cleaned with a wet and dry 4x4.   A clear shield was taped over the eye. The patient was taken to the post-operative care unit in good condition, having tolerated the procedure well.  Post-Op Instructions: The patient will follow up at [facility] for a same day post-operative evaluation and will receive all other orders and instructions.

## 2024-06-26 NOTE — Anesthesia Postprocedure Evaluation (Signed)
 Anesthesia Post Note  Patient: Shane Beard  Procedure(s) Performed: PHACOEMULSIFICATION, CATARACT, WITH IOL INSERTION (Left: Eye) INSERTION, STENT, DRUG-ELUTING, LACRIMAL CANALICULUS (Left: Eye)  Patient location during evaluation: PACU Anesthesia Type: MAC Level of consciousness: awake and alert Pain management: pain level controlled Vital Signs Assessment: post-procedure vital signs reviewed and stable Respiratory status: spontaneous breathing, nonlabored ventilation, respiratory function stable and patient connected to nasal cannula oxygen Cardiovascular status: stable and blood pressure returned to baseline Postop Assessment: no apparent nausea or vomiting Anesthetic complications: no   No notable events documented.   Last Vitals:  Vitals:   06/26/24 0958 06/26/24 1140  BP: (!) 152/86 137/79  Pulse: (!) 48 (!) 45  Resp: 15 16  Temp: 36.6 C 36.5 C  SpO2: 100% 100%    Last Pain:  Vitals:   06/26/24 1140  TempSrc: Oral  PainSc: 0-No pain                 Andrea Limes

## 2024-06-26 NOTE — Discharge Instructions (Addendum)
 Please discharge patient when stable, will follow up today with Dr. June Leap at the [facility] office immediately following discharge.  Leave shield in place until visit.  All paperwork with discharge instructions will be given at the office.  [facility] Address:  [address]  [place], [place] [address]

## 2024-06-27 ENCOUNTER — Encounter (HOSPITAL_COMMUNITY): Payer: Self-pay | Admitting: Ophthalmology

## 2024-07-19 ENCOUNTER — Other Ambulatory Visit (HOSPITAL_COMMUNITY): Payer: Medicare (Managed Care)

## 2025-01-31 ENCOUNTER — Ambulatory Visit: Payer: Medicare (Managed Care) | Admitting: Cardiology
